# Patient Record
Sex: Male | Born: 1945 | Race: White | Hispanic: No | Marital: Married | Smoking: Never smoker
Health system: Southern US, Community
[De-identification: ages and names within clinical notes are randomized; demographics above are authoritative.]

## PROBLEM LIST (undated history)

## (undated) DIAGNOSIS — D693 Immune thrombocytopenic purpura: Secondary | ICD-10-CM

## (undated) DIAGNOSIS — C61 Malignant neoplasm of prostate: Secondary | ICD-10-CM

## (undated) HISTORY — DX: Malignant neoplasm of prostate: C61

## (undated) HISTORY — DX: Immune thrombocytopenic purpura: D69.3

---

## 1978-04-24 HISTORY — PX: BILATERAL KNEE ARTHROSCOPY: SUR91

## 1993-04-24 HISTORY — PX: NEPHRECTOMY: SHX65

## 2006-06-11 DIAGNOSIS — C61 Malignant neoplasm of prostate: Secondary | ICD-10-CM

## 2006-06-11 HISTORY — DX: Malignant neoplasm of prostate: C61

## 2006-07-03 ENCOUNTER — Ambulatory Visit: Admission: RE | Admit: 2006-07-03 | Discharge: 2006-09-07 | Payer: Self-pay | Admitting: Radiation Oncology

## 2006-09-05 ENCOUNTER — Encounter (INDEPENDENT_AMBULATORY_CARE_PROVIDER_SITE_OTHER): Payer: Self-pay | Admitting: Specialist

## 2006-09-05 ENCOUNTER — Inpatient Hospital Stay (HOSPITAL_COMMUNITY): Admission: RE | Admit: 2006-09-05 | Discharge: 2006-09-06 | Payer: Self-pay | Admitting: Urology

## 2008-12-23 DIAGNOSIS — D693 Immune thrombocytopenic purpura: Secondary | ICD-10-CM

## 2008-12-23 HISTORY — DX: Immune thrombocytopenic purpura: D69.3

## 2009-01-13 ENCOUNTER — Inpatient Hospital Stay (HOSPITAL_COMMUNITY): Admission: EM | Admit: 2009-01-13 | Discharge: 2009-01-15 | Payer: Self-pay | Admitting: Emergency Medicine

## 2009-01-13 ENCOUNTER — Ambulatory Visit: Payer: Self-pay | Admitting: Oncology

## 2009-01-15 ENCOUNTER — Ambulatory Visit: Payer: Self-pay | Admitting: Oncology

## 2009-01-18 LAB — CBC WITH DIFFERENTIAL/PLATELET
Basophils Absolute: 0 10*3/uL (ref 0.0–0.1)
HCT: 49.5 % (ref 38.4–49.9)
HGB: 17.6 g/dL — ABNORMAL HIGH (ref 13.0–17.1)
MONO#: 0.6 10*3/uL (ref 0.1–0.9)
NEUT%: 85.8 % — ABNORMAL HIGH (ref 39.0–75.0)
WBC: 11.5 10*3/uL — ABNORMAL HIGH (ref 4.0–10.3)
lymph#: 1 10*3/uL (ref 0.9–3.3)

## 2009-01-21 LAB — CBC WITH DIFFERENTIAL/PLATELET
Basophils Absolute: 0 10*3/uL (ref 0.0–0.1)
EOS%: 0.5 % (ref 0.0–7.0)
HCT: 49.2 % (ref 38.4–49.9)
HGB: 17.9 g/dL — ABNORMAL HIGH (ref 13.0–17.1)
MCH: 33.5 pg — ABNORMAL HIGH (ref 27.2–33.4)
MCV: 92.1 fL (ref 79.3–98.0)
MONO%: 10.1 % (ref 0.0–14.0)
NEUT%: 64.7 % (ref 39.0–75.0)
lymph#: 2.9 10*3/uL (ref 0.9–3.3)

## 2009-01-21 LAB — BASIC METABOLIC PANEL
BUN: 24 mg/dL — ABNORMAL HIGH (ref 6–23)
Chloride: 100 mEq/L (ref 96–112)
Creatinine, Ser: 1.15 mg/dL (ref 0.40–1.50)
Glucose, Bld: 110 mg/dL — ABNORMAL HIGH (ref 70–99)

## 2009-01-27 LAB — CBC WITH DIFFERENTIAL/PLATELET
Eosinophils Absolute: 0.1 10*3/uL (ref 0.0–0.5)
HCT: 49.1 % (ref 38.4–49.9)
LYMPH%: 20.9 % (ref 14.0–49.0)
MCHC: 35.4 g/dL (ref 32.0–36.0)
MCV: 94.6 fL (ref 79.3–98.0)
MONO#: 1 10*3/uL — ABNORMAL HIGH (ref 0.1–0.9)
MONO%: 8.4 % (ref 0.0–14.0)
NEUT#: 8.6 10*3/uL — ABNORMAL HIGH (ref 1.5–6.5)
NEUT%: 69.8 % (ref 39.0–75.0)
Platelets: 53 10*3/uL — ABNORMAL LOW (ref 140–400)
WBC: 12.3 10*3/uL — ABNORMAL HIGH (ref 4.0–10.3)

## 2009-02-02 LAB — BASIC METABOLIC PANEL
CO2: 25 mEq/L (ref 19–32)
Calcium: 9.4 mg/dL (ref 8.4–10.5)
Chloride: 100 mEq/L (ref 96–112)
Glucose, Bld: 108 mg/dL — ABNORMAL HIGH (ref 70–99)
Potassium: 4.2 mEq/L (ref 3.5–5.3)
Sodium: 138 mEq/L (ref 135–145)

## 2009-02-02 LAB — CBC WITH DIFFERENTIAL/PLATELET
Basophils Absolute: 0 10*3/uL (ref 0.0–0.1)
EOS%: 0 % (ref 0.0–7.0)
HCT: 51.8 % — ABNORMAL HIGH (ref 38.4–49.9)
HGB: 18 g/dL — ABNORMAL HIGH (ref 13.0–17.1)
LYMPH%: 3.9 % — ABNORMAL LOW (ref 14.0–49.0)
MCH: 33.9 pg — ABNORMAL HIGH (ref 27.2–33.4)
MCHC: 34.8 g/dL (ref 32.0–36.0)
MCV: 97.5 fL (ref 79.3–98.0)
MONO%: 2.6 % (ref 0.0–14.0)
NEUT%: 93.5 % — ABNORMAL HIGH (ref 39.0–75.0)

## 2009-02-10 LAB — CBC WITH DIFFERENTIAL/PLATELET
BASO%: 0.1 % (ref 0.0–2.0)
Basophils Absolute: 0 10*3/uL (ref 0.0–0.1)
EOS%: 0.5 % (ref 0.0–7.0)
HCT: 52.5 % — ABNORMAL HIGH (ref 38.4–49.9)
HGB: 18.5 g/dL — ABNORMAL HIGH (ref 13.0–17.1)
LYMPH%: 4.2 % — ABNORMAL LOW (ref 14.0–49.0)
MCH: 34.2 pg — ABNORMAL HIGH (ref 27.2–33.4)
MCHC: 35.2 g/dL (ref 32.0–36.0)
MCV: 97 fL (ref 79.3–98.0)
NEUT%: 91 % — ABNORMAL HIGH (ref 39.0–75.0)
Platelets: 57 10*3/uL — ABNORMAL LOW (ref 140–400)
lymph#: 0.6 10*3/uL — ABNORMAL LOW (ref 0.9–3.3)

## 2009-02-10 LAB — BASIC METABOLIC PANEL
CO2: 26 mEq/L (ref 19–32)
Chloride: 105 mEq/L (ref 96–112)
Creatinine, Ser: 1.17 mg/dL (ref 0.40–1.50)
Glucose, Bld: 99 mg/dL (ref 70–99)

## 2009-02-10 LAB — MORPHOLOGY: PLT EST: DECREASED

## 2009-02-15 ENCOUNTER — Ambulatory Visit: Payer: Self-pay | Admitting: Oncology

## 2009-02-17 LAB — CBC WITH DIFFERENTIAL/PLATELET
BASO%: 0 % (ref 0.0–2.0)
Basophils Absolute: 0 10*3/uL (ref 0.0–0.1)
EOS%: 0.3 % (ref 0.0–7.0)
HCT: 56 % — ABNORMAL HIGH (ref 38.4–49.9)
HGB: 19 g/dL — ABNORMAL HIGH (ref 13.0–17.1)
MCH: 33.7 pg — ABNORMAL HIGH (ref 27.2–33.4)
MCHC: 34 g/dL (ref 32.0–36.0)
MCV: 99.2 fL — ABNORMAL HIGH (ref 79.3–98.0)
MONO%: 6.7 % (ref 0.0–14.0)
NEUT%: 79.3 % — ABNORMAL HIGH (ref 39.0–75.0)

## 2009-02-17 LAB — BASIC METABOLIC PANEL
Calcium: 9.4 mg/dL (ref 8.4–10.5)
Sodium: 137 mEq/L (ref 135–145)

## 2009-02-24 IMAGING — CR DG CHEST 2V
2 series · 2 of 2 positions shown · non-contrast
Comparison: None.

CLINICAL DATA: Prostate cancer.  Preop.  
 CHEST - 2 VIEW:

[view not recorded (1 of 2)]
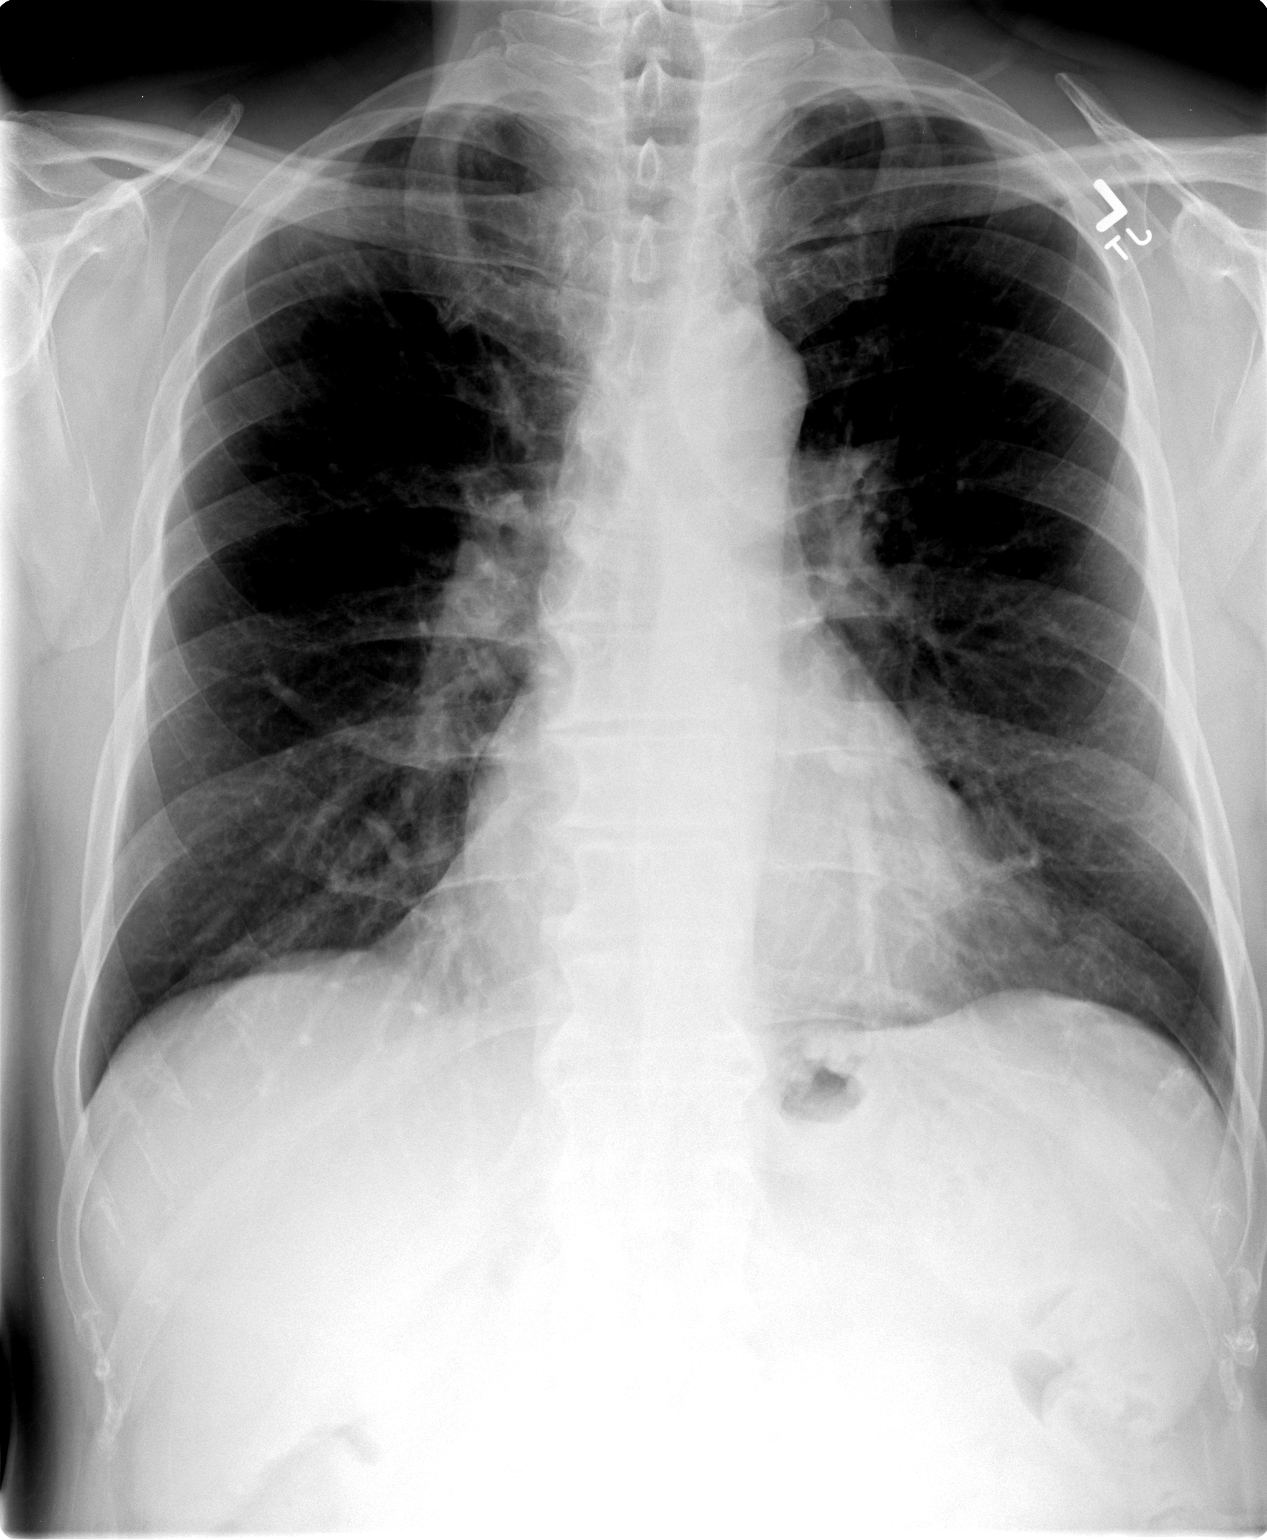

[view not recorded (2 of 2)]
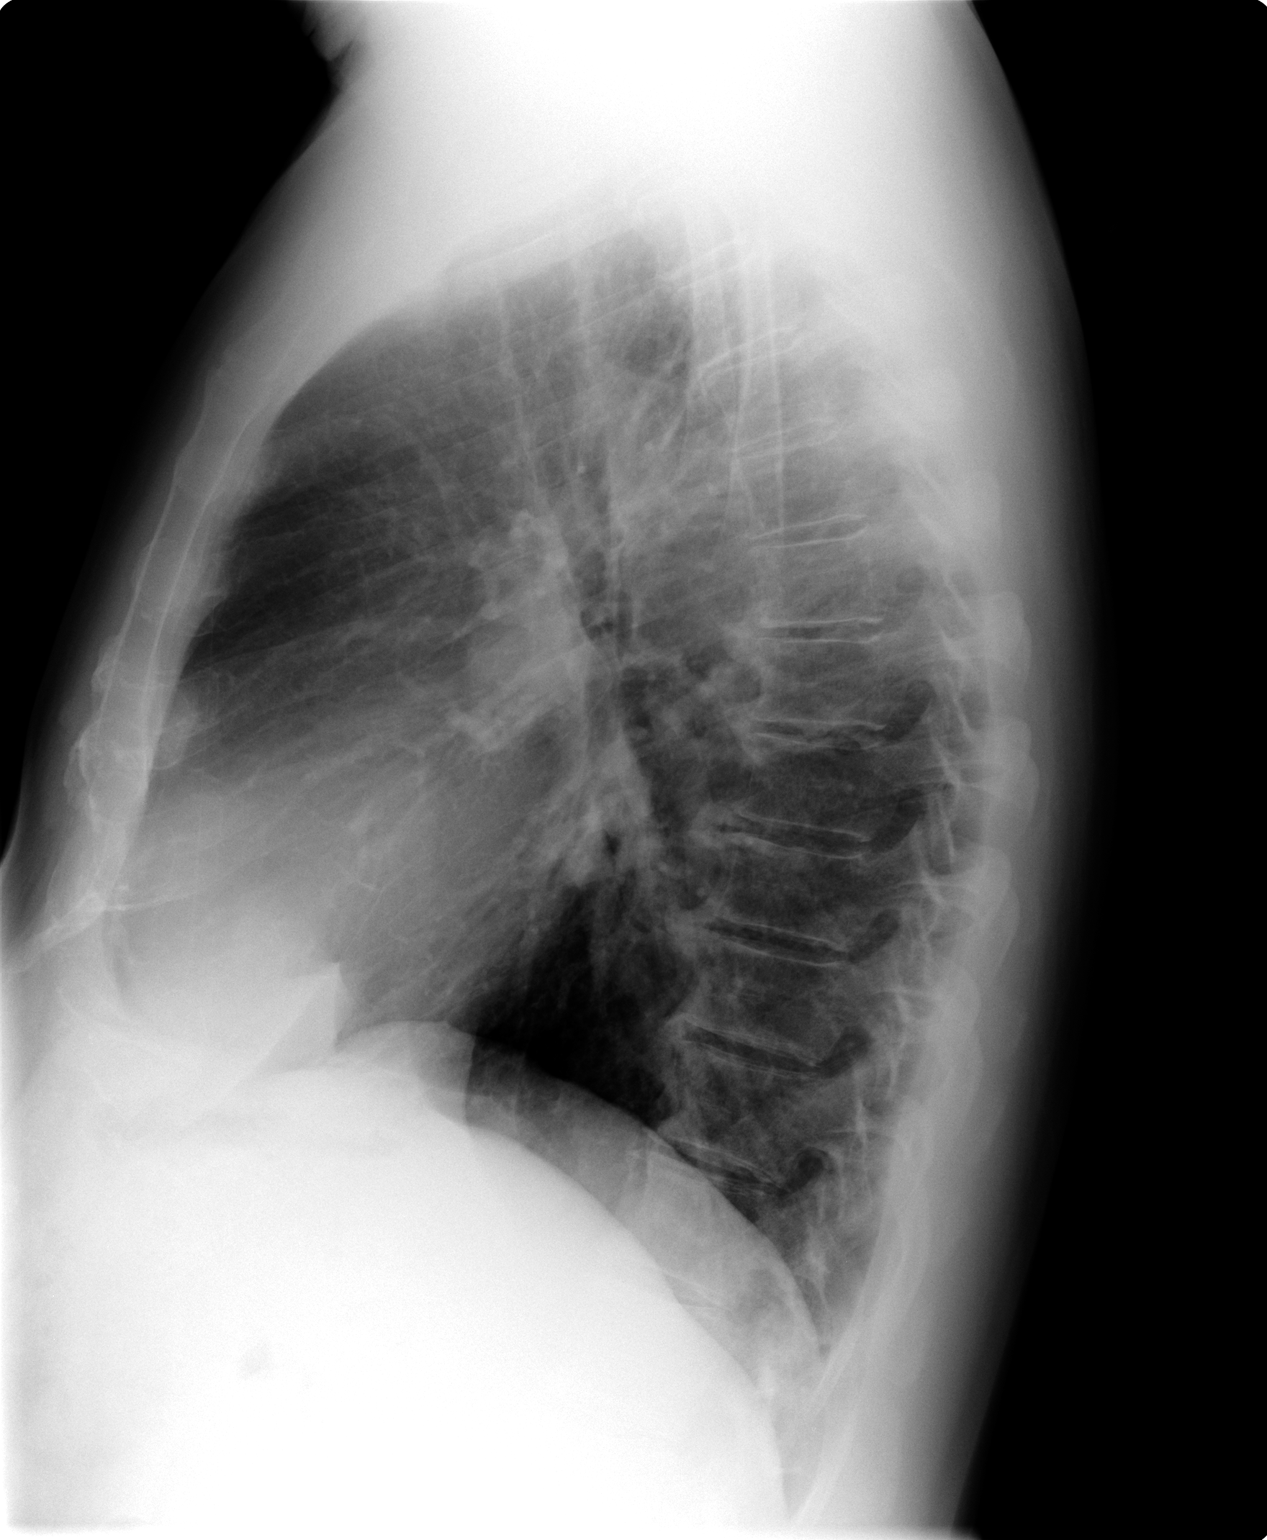

[2 of 2 positions shown; findings below may reference images not displayed]

FINDINGS: Heart size is within normal limits.  There is hyperinflation with change of COPD.  No infiltrate, effusion or mass.
IMPRESSION: COPD.  No acute abnormality.

## 2009-02-26 LAB — CBC WITH DIFFERENTIAL/PLATELET
Basophils Absolute: 0 10*3/uL (ref 0.0–0.1)
Eosinophils Absolute: 0 10*3/uL (ref 0.0–0.5)
HGB: 18.2 g/dL — ABNORMAL HIGH (ref 13.0–17.1)
MCV: 95.4 fL (ref 79.3–98.0)
MONO%: 3.5 % (ref 0.0–14.0)
NEUT#: 11.4 10*3/uL — ABNORMAL HIGH (ref 1.5–6.5)
Platelets: 21 10*3/uL — ABNORMAL LOW (ref 140–400)
RDW: 13 % (ref 11.0–14.6)

## 2009-02-26 LAB — BASIC METABOLIC PANEL
CO2: 25 mEq/L (ref 19–32)
Calcium: 8.3 mg/dL — ABNORMAL LOW (ref 8.4–10.5)
Glucose, Bld: 91 mg/dL (ref 70–99)
Sodium: 136 mEq/L (ref 135–145)

## 2009-03-04 LAB — COMPREHENSIVE METABOLIC PANEL
AST: 27 U/L (ref 0–37)
Albumin: 3.8 g/dL (ref 3.5–5.2)
BUN: 28 mg/dL — ABNORMAL HIGH (ref 6–23)
Calcium: 8.8 mg/dL (ref 8.4–10.5)
Chloride: 101 mEq/L (ref 96–112)
Potassium: 3.8 mEq/L (ref 3.5–5.3)
Total Protein: 6.1 g/dL (ref 6.0–8.3)

## 2009-03-04 LAB — CBC WITH DIFFERENTIAL/PLATELET
BASO%: 0.2 % (ref 0.0–2.0)
EOS%: 0 % (ref 0.0–7.0)
HCT: 51.9 % — ABNORMAL HIGH (ref 38.4–49.9)
LYMPH%: 9.4 % — ABNORMAL LOW (ref 14.0–49.0)
MCH: 33.9 pg — ABNORMAL HIGH (ref 27.2–33.4)
MCHC: 34.8 g/dL (ref 32.0–36.0)
MCV: 97.6 fL (ref 79.3–98.0)
MONO%: 5.9 % (ref 0.0–14.0)
NEUT%: 84.5 % — ABNORMAL HIGH (ref 39.0–75.0)
Platelets: 71 10*3/uL — ABNORMAL LOW (ref 140–400)
lymph#: 1 10*3/uL (ref 0.9–3.3)

## 2009-03-12 LAB — CBC WITH DIFFERENTIAL/PLATELET
BASO%: 0.2 % (ref 0.0–2.0)
EOS%: 0 % (ref 0.0–7.0)
Eosinophils Absolute: 0 10*3/uL (ref 0.0–0.5)
MCH: 33.9 pg — ABNORMAL HIGH (ref 27.2–33.4)
MCHC: 35 g/dL (ref 32.0–36.0)
MCV: 96.9 fL (ref 79.3–98.0)
MONO%: 6.4 % (ref 0.0–14.0)
NEUT#: 8.6 10*3/uL — ABNORMAL HIGH (ref 1.5–6.5)
RBC: 5.16 10*6/uL (ref 4.20–5.82)
RDW: 12.7 % (ref 11.0–14.6)

## 2009-03-12 LAB — COMPREHENSIVE METABOLIC PANEL
AST: 22 U/L (ref 0–37)
Alkaline Phosphatase: 66 U/L (ref 39–117)
BUN: 20 mg/dL (ref 6–23)
Creatinine, Ser: 1.08 mg/dL (ref 0.40–1.50)
Glucose, Bld: 85 mg/dL (ref 70–99)
Total Bilirubin: 1.6 mg/dL — ABNORMAL HIGH (ref 0.3–1.2)

## 2009-03-12 LAB — MORPHOLOGY

## 2009-03-15 ENCOUNTER — Ambulatory Visit: Payer: Self-pay | Admitting: Oncology

## 2009-03-19 LAB — CBC WITH DIFFERENTIAL/PLATELET
EOS%: 0 % (ref 0.0–7.0)
Eosinophils Absolute: 0 10*3/uL (ref 0.0–0.5)
LYMPH%: 19.2 % (ref 14.0–49.0)
MCH: 33 pg (ref 27.2–33.4)
MCV: 95 fL (ref 79.3–98.0)
MONO%: 10.9 % (ref 0.0–14.0)
NEUT#: 6.3 10*3/uL (ref 1.5–6.5)
Platelets: 143 10*3/uL (ref 140–400)
RBC: 5.39 10*6/uL (ref 4.20–5.82)
RDW: 13 % (ref 11.0–14.6)
nRBC: 0 % (ref 0–0)

## 2009-03-20 ENCOUNTER — Emergency Department (HOSPITAL_COMMUNITY): Admission: EM | Admit: 2009-03-20 | Discharge: 2009-03-20 | Payer: Self-pay | Admitting: Emergency Medicine

## 2009-03-26 LAB — CBC WITH DIFFERENTIAL/PLATELET
BASO%: 1 % (ref 0.0–2.0)
Basophils Absolute: 0.1 10*3/uL (ref 0.0–0.1)
EOS%: 0.3 % (ref 0.0–7.0)
HCT: 49.3 % (ref 38.4–49.9)
HGB: 17.3 g/dL — ABNORMAL HIGH (ref 13.0–17.1)
LYMPH%: 19.6 % (ref 14.0–49.0)
MCH: 33.5 pg — ABNORMAL HIGH (ref 27.2–33.4)
MCHC: 35.1 g/dL (ref 32.0–36.0)
MCV: 95.4 fL (ref 79.3–98.0)
MONO%: 8.9 % (ref 0.0–14.0)
NEUT%: 70.2 % (ref 39.0–75.0)
Platelets: 135 10*3/uL — ABNORMAL LOW (ref 140–400)

## 2009-03-26 LAB — COMPREHENSIVE METABOLIC PANEL
ALT: 40 U/L (ref 0–53)
BUN: 23 mg/dL (ref 6–23)
CO2: 30 mEq/L (ref 19–32)
Calcium: 8.8 mg/dL (ref 8.4–10.5)
Chloride: 103 mEq/L (ref 96–112)
Creatinine, Ser: 1.2 mg/dL (ref 0.40–1.50)
Glucose, Bld: 75 mg/dL (ref 70–99)
Total Bilirubin: 1.6 mg/dL — ABNORMAL HIGH (ref 0.3–1.2)

## 2009-03-26 LAB — LACTATE DEHYDROGENASE: LDH: 238 U/L (ref 94–250)

## 2009-03-26 LAB — MORPHOLOGY: PLT EST: DECREASED

## 2009-04-02 LAB — CBC WITH DIFFERENTIAL/PLATELET
BASO%: 0.8 % (ref 0.0–2.0)
Basophils Absolute: 0.1 10*3/uL (ref 0.0–0.1)
EOS%: 1.1 % (ref 0.0–7.0)
HGB: 17.4 g/dL — ABNORMAL HIGH (ref 13.0–17.1)
MCH: 32.9 pg (ref 27.2–33.4)
MONO#: 0.7 10*3/uL (ref 0.1–0.9)
RDW: 13.1 % (ref 11.0–14.6)
WBC: 6.6 10*3/uL (ref 4.0–10.3)
lymph#: 1.3 10*3/uL (ref 0.9–3.3)

## 2009-04-09 LAB — CBC WITH DIFFERENTIAL/PLATELET
Basophils Absolute: 0.1 10*3/uL (ref 0.0–0.1)
Eosinophils Absolute: 0.1 10*3/uL (ref 0.0–0.5)
HCT: 49.6 % (ref 38.4–49.9)
HGB: 17.4 g/dL — ABNORMAL HIGH (ref 13.0–17.1)
MCV: 94.1 fL (ref 79.3–98.0)
NEUT#: 4.5 10*3/uL (ref 1.5–6.5)
RDW: 13.4 % (ref 11.0–14.6)
lymph#: 1.3 10*3/uL (ref 0.9–3.3)

## 2009-04-19 ENCOUNTER — Ambulatory Visit: Payer: Self-pay | Admitting: Oncology

## 2009-04-21 LAB — CBC WITH DIFFERENTIAL/PLATELET
Basophils Absolute: 0 10*3/uL (ref 0.0–0.1)
Eosinophils Absolute: 0.1 10*3/uL (ref 0.0–0.5)
HGB: 17.4 g/dL — ABNORMAL HIGH (ref 13.0–17.1)
LYMPH%: 24.3 % (ref 14.0–49.0)
MCV: 96.7 fL (ref 79.3–98.0)
MONO%: 8.4 % (ref 0.0–14.0)
NEUT#: 5.6 10*3/uL (ref 1.5–6.5)
NEUT%: 65.7 % (ref 39.0–75.0)
Platelets: 177 10*3/uL (ref 140–400)

## 2009-04-21 LAB — MORPHOLOGY

## 2009-05-12 LAB — CBC WITH DIFFERENTIAL/PLATELET
BASO%: 0.5 % (ref 0.0–2.0)
Basophils Absolute: 0 10e3/uL (ref 0.0–0.1)
EOS%: 2.4 % (ref 0.0–7.0)
Eosinophils Absolute: 0.2 10e3/uL (ref 0.0–0.5)
HCT: 49.9 % (ref 38.4–49.9)
HGB: 17.5 g/dL — ABNORMAL HIGH (ref 13.0–17.1)
LYMPH%: 25.1 % (ref 14.0–49.0)
MCH: 33.8 pg — ABNORMAL HIGH (ref 27.2–33.4)
MCHC: 35 g/dL (ref 32.0–36.0)
MCV: 96.5 fL (ref 79.3–98.0)
MONO#: 0.6 10e3/uL (ref 0.1–0.9)
MONO%: 7.8 % (ref 0.0–14.0)
NEUT#: 4.8 10e3/uL (ref 1.5–6.5)
NEUT%: 64.2 % (ref 39.0–75.0)
Platelets: 159 10e3/uL (ref 140–400)
RBC: 5.17 10e6/uL (ref 4.20–5.82)
RDW: 13.6 % (ref 11.0–14.6)
WBC: 7.5 10e3/uL (ref 4.0–10.3)
lymph#: 1.9 10e3/uL (ref 0.9–3.3)

## 2009-05-12 LAB — COMPREHENSIVE METABOLIC PANEL WITH GFR
ALT: 26 U/L (ref 0–53)
AST: 24 U/L (ref 0–37)
Albumin: 4.3 g/dL (ref 3.5–5.2)
Alkaline Phosphatase: 79 U/L (ref 39–117)
BUN: 17 mg/dL (ref 6–23)
CO2: 27 meq/L (ref 19–32)
Calcium: 9.1 mg/dL (ref 8.4–10.5)
Chloride: 103 meq/L (ref 96–112)
Creatinine, Ser: 1.07 mg/dL (ref 0.40–1.50)
Glucose, Bld: 75 mg/dL (ref 70–99)
Potassium: 4.3 meq/L (ref 3.5–5.3)
Sodium: 142 meq/L (ref 135–145)
Total Bilirubin: 2.4 mg/dL — ABNORMAL HIGH (ref 0.3–1.2)
Total Protein: 6.4 g/dL (ref 6.0–8.3)

## 2009-05-31 ENCOUNTER — Ambulatory Visit: Payer: Self-pay | Admitting: Oncology

## 2009-06-02 LAB — CBC WITH DIFFERENTIAL/PLATELET
Basophils Absolute: 0 10*3/uL (ref 0.0–0.1)
EOS%: 2.7 % (ref 0.0–7.0)
HCT: 50.8 % — ABNORMAL HIGH (ref 38.4–49.9)
HGB: 17.8 g/dL — ABNORMAL HIGH (ref 13.0–17.1)
MCH: 33.8 pg — ABNORMAL HIGH (ref 27.2–33.4)
MCV: 96.3 fL (ref 79.3–98.0)
MONO%: 8.4 % (ref 0.0–14.0)
NEUT%: 61.9 % (ref 39.0–75.0)
Platelets: 149 10*3/uL (ref 140–400)

## 2009-06-02 LAB — URINALYSIS, MICROSCOPIC - CHCC
Bacteria, UA: NEGATIVE
Bilirubin (Urine): NEGATIVE
Glucose: NEGATIVE g/dL
Ketones: 5 mg/dL
pH: 6 (ref 4.6–8.0)

## 2009-06-02 LAB — COMPREHENSIVE METABOLIC PANEL
AST: 28 U/L (ref 0–37)
Alkaline Phosphatase: 77 U/L (ref 39–117)
BUN: 18 mg/dL (ref 6–23)
Calcium: 9 mg/dL (ref 8.4–10.5)
Creatinine, Ser: 1.18 mg/dL (ref 0.40–1.50)

## 2009-06-17 ENCOUNTER — Ambulatory Visit (HOSPITAL_BASED_OUTPATIENT_CLINIC_OR_DEPARTMENT_OTHER): Admission: RE | Admit: 2009-06-17 | Discharge: 2009-06-17 | Payer: Self-pay | Admitting: Urology

## 2009-06-30 ENCOUNTER — Ambulatory Visit: Payer: Self-pay | Admitting: Oncology

## 2009-07-02 LAB — CBC WITH DIFFERENTIAL/PLATELET
Basophils Absolute: 0 10*3/uL (ref 0.0–0.1)
HCT: 52 % — ABNORMAL HIGH (ref 38.4–49.9)
HGB: 18.3 g/dL — ABNORMAL HIGH (ref 13.0–17.1)
MCH: 33.7 pg — ABNORMAL HIGH (ref 27.2–33.4)
MONO#: 0.6 10*3/uL (ref 0.1–0.9)
NEUT%: 61.6 % (ref 39.0–75.0)
WBC: 7.2 10*3/uL (ref 4.0–10.3)
lymph#: 1.9 10*3/uL (ref 0.9–3.3)

## 2009-07-30 ENCOUNTER — Ambulatory Visit: Payer: Self-pay | Admitting: Oncology

## 2009-07-30 LAB — CBC WITH DIFFERENTIAL/PLATELET
Eosinophils Absolute: 0.2 10*3/uL (ref 0.0–0.5)
HCT: 51 % — ABNORMAL HIGH (ref 38.4–49.9)
LYMPH%: 27.3 % (ref 14.0–49.0)
MONO#: 0.6 10*3/uL (ref 0.1–0.9)
NEUT#: 4.6 10*3/uL (ref 1.5–6.5)
Platelets: 151 10*3/uL (ref 140–400)
RBC: 5.52 10*6/uL (ref 4.20–5.82)
WBC: 7.5 10*3/uL (ref 4.0–10.3)
nRBC: 0 % (ref 0–0)

## 2009-09-14 ENCOUNTER — Ambulatory Visit (HOSPITAL_BASED_OUTPATIENT_CLINIC_OR_DEPARTMENT_OTHER): Admission: RE | Admit: 2009-09-14 | Discharge: 2009-09-14 | Payer: Self-pay | Admitting: Urology

## 2009-09-27 ENCOUNTER — Ambulatory Visit: Payer: Self-pay | Admitting: Oncology

## 2009-09-29 LAB — CBC WITH DIFFERENTIAL/PLATELET
Basophils Absolute: 0 10*3/uL (ref 0.0–0.1)
Eosinophils Absolute: 0.2 10*3/uL (ref 0.0–0.5)
HCT: 50.5 % — ABNORMAL HIGH (ref 38.4–49.9)
HGB: 17.9 g/dL — ABNORMAL HIGH (ref 13.0–17.1)
LYMPH%: 28.2 % (ref 14.0–49.0)
MCV: 95.4 fL (ref 79.3–98.0)
MONO%: 8 % (ref 0.0–14.0)
NEUT#: 3.8 10*3/uL (ref 1.5–6.5)
NEUT%: 60.5 % (ref 39.0–75.0)
Platelets: 131 10*3/uL — ABNORMAL LOW (ref 140–400)

## 2009-09-29 LAB — MORPHOLOGY

## 2009-10-28 ENCOUNTER — Ambulatory Visit: Payer: Self-pay | Admitting: Oncology

## 2009-10-28 LAB — MORPHOLOGY: PLT EST: DECREASED

## 2009-10-28 LAB — CBC WITH DIFFERENTIAL/PLATELET
Basophils Absolute: 0 10*3/uL (ref 0.0–0.1)
Eosinophils Absolute: 0.2 10*3/uL (ref 0.0–0.5)
HCT: 50 % — ABNORMAL HIGH (ref 38.4–49.9)
HGB: 17.7 g/dL — ABNORMAL HIGH (ref 13.0–17.1)
NEUT#: 3.8 10*3/uL (ref 1.5–6.5)
RDW: 12.9 % (ref 11.0–14.6)
lymph#: 1.8 10*3/uL (ref 0.9–3.3)

## 2009-12-23 ENCOUNTER — Ambulatory Visit: Payer: Self-pay | Admitting: Oncology

## 2009-12-23 LAB — CBC WITH DIFFERENTIAL/PLATELET
Basophils Absolute: 0 10*3/uL (ref 0.0–0.1)
EOS%: 2.6 % (ref 0.0–7.0)
Eosinophils Absolute: 0.2 10*3/uL (ref 0.0–0.5)
LYMPH%: 24.2 % (ref 14.0–49.0)
MCH: 33 pg (ref 27.2–33.4)
MCV: 96.3 fL (ref 79.3–98.0)
MONO%: 8 % (ref 0.0–14.0)
NEUT#: 4.6 10*3/uL (ref 1.5–6.5)
Platelets: 135 10*3/uL — ABNORMAL LOW (ref 140–400)
RBC: 5.24 10*6/uL (ref 4.20–5.82)

## 2009-12-23 LAB — MORPHOLOGY

## 2010-01-31 ENCOUNTER — Ambulatory Visit: Payer: Self-pay | Admitting: Oncology

## 2010-02-02 LAB — CBC WITH DIFFERENTIAL/PLATELET
BASO%: 0.5 % (ref 0.0–2.0)
Basophils Absolute: 0 10*3/uL (ref 0.0–0.1)
EOS%: 2.9 % (ref 0.0–7.0)
HCT: 51.3 % — ABNORMAL HIGH (ref 38.4–49.9)
HGB: 18.4 g/dL — ABNORMAL HIGH (ref 13.0–17.1)
LYMPH%: 27.5 % (ref 14.0–49.0)
MCH: 33.3 pg (ref 27.2–33.4)
MCHC: 35.9 g/dL (ref 32.0–36.0)
MCV: 92.8 fL (ref 79.3–98.0)
NEUT%: 60.6 % (ref 39.0–75.0)
Platelets: 144 10*3/uL (ref 140–400)
lymph#: 2.2 10*3/uL (ref 0.9–3.3)

## 2010-03-28 ENCOUNTER — Ambulatory Visit: Payer: Self-pay | Admitting: Oncology

## 2010-03-30 LAB — CBC WITH DIFFERENTIAL/PLATELET
BASO%: 0.5 % (ref 0.0–2.0)
EOS%: 1.7 % (ref 0.0–7.0)
HCT: 50.7 % — ABNORMAL HIGH (ref 38.4–49.9)
MCHC: 36.5 g/dL — ABNORMAL HIGH (ref 32.0–36.0)
MONO#: 0.7 10*3/uL (ref 0.1–0.9)
NEUT%: 62.9 % (ref 39.0–75.0)
RBC: 5.55 10*6/uL (ref 4.20–5.82)
RDW: 12.8 % (ref 11.0–14.6)
WBC: 7.6 10*3/uL (ref 4.0–10.3)
lymph#: 2 10*3/uL (ref 0.9–3.3)

## 2010-03-30 LAB — MORPHOLOGY
PLT EST: DECREASED
RBC Comments: NORMAL

## 2010-03-30 LAB — PSA: PSA: <0.01 ng/mL (ref <=4.00)

## 2010-07-11 LAB — CBC
MCHC: 34.5 g/dL (ref 30.0–36.0)
Platelets: 101 10*3/uL — ABNORMAL LOW (ref 150–400)
RDW: 13.1 % (ref 11.5–15.5)

## 2010-07-29 LAB — PROTEIN ELECTROPHORESIS, SERUM
Alpha-1-Globulin: 3.5 % (ref 2.9–4.9)
Gamma Globulin: 15.4 % (ref 11.1–18.8)
Total Protein ELP: 6.1 g/dL (ref 6.0–8.3)

## 2010-07-29 LAB — COMPREHENSIVE METABOLIC PANEL
ALT: 22 U/L (ref 0–53)
AST: 31 U/L (ref 0–37)
Alkaline Phosphatase: 88 U/L (ref 39–117)
BUN: 19 mg/dL (ref 6–23)
CO2: 23 mEq/L (ref 19–32)
CO2: 27 mEq/L (ref 19–32)
Calcium: 9.1 mg/dL (ref 8.4–10.5)
Calcium: 9.1 mg/dL (ref 8.4–10.5)
Creatinine, Ser: 1.03 mg/dL (ref 0.4–1.5)
GFR calc Af Amer: >60 mL/min (ref >60)
GFR calc non Af Amer: >60 mL/min (ref >60)
GFR calc non Af Amer: >60 mL/min (ref >60)
Glucose, Bld: 170 mg/dL — ABNORMAL HIGH (ref 70–99)
Glucose, Bld: 97 mg/dL (ref 70–99)
Potassium: 3.9 mEq/L (ref 3.5–5.1)
Sodium: 137 mEq/L (ref 135–145)
Sodium: 139 mEq/L (ref 135–145)
Total Protein: 6.1 g/dL (ref 6.0–8.3)

## 2010-07-29 LAB — PREPARE FRESH FROZEN PLASMA

## 2010-07-29 LAB — CBC
HCT: 49.5 % (ref 39.0–52.0)
Hemoglobin: 17 g/dL (ref 13.0–17.0)
Hemoglobin: 17.5 g/dL — ABNORMAL HIGH (ref 13.0–17.0)
MCHC: 34.5 g/dL (ref 30.0–36.0)
MCHC: 35.1 g/dL (ref 30.0–36.0)
MCV: 97 fL (ref 78.0–100.0)
MCV: 98.3 fL (ref 78.0–100.0)
MCV: 99.1 fL (ref 78.0–100.0)
Platelets: <5 10*3/uL — CL (ref 150–400)
RBC: 5.09 MIL/uL (ref 4.22–5.81)
RBC: 5.15 MIL/uL (ref 4.22–5.81)
RDW: 12.8 % (ref 11.5–15.5)
RDW: 13.2 % (ref 11.5–15.5)
WBC: 7.4 10*3/uL (ref 4.0–10.5)
WBC: 7.7 10*3/uL (ref 4.0–10.5)

## 2010-07-29 LAB — DIFFERENTIAL
Basophils Absolute: 0 10*3/uL (ref 0.0–0.1)
Basophils Absolute: 0.1 10*3/uL (ref 0.0–0.1)
Basophils Relative: 0 % (ref 0–1)
Basophils Relative: 1 % (ref 0–1)
Eosinophils Absolute: 0 10*3/uL (ref 0.0–0.7)
Eosinophils Absolute: 0.3 10*3/uL (ref 0.0–0.7)
Eosinophils Relative: 4 % (ref 0–5)
Lymphocytes Relative: 24 % (ref 12–46)
Lymphs Abs: 1.8 10*3/uL (ref 0.7–4.0)
Lymphs Abs: 2.1 10*3/uL (ref 0.7–4.0)
Monocytes Absolute: 0.7 10*3/uL (ref 0.1–1.0)
Monocytes Relative: 9 % (ref 3–12)
Neutro Abs: 12.3 10*3/uL — ABNORMAL HIGH (ref 1.7–7.7)
Neutrophils Relative %: 56 % (ref 43–77)
Neutrophils Relative %: 63 % (ref 43–77)
Neutrophils Relative %: 82 % — ABNORMAL HIGH (ref 43–77)
Smear Review: NONE SEEN

## 2010-07-29 LAB — HEPATITIS PANEL, ACUTE
HCV Ab: NEGATIVE
Hep A IgM: NEGATIVE

## 2010-07-29 LAB — PROTIME-INR: INR: 1 (ref 0.00–1.49)

## 2010-07-29 LAB — HEPARIN INDUCED THROMBOCYTOPENIA PNL
Heparin Induced Plt Ab: NEGATIVE
Patient O.D.: 0.21
Serotonin Release: 1 % release (ref <20)

## 2010-07-29 LAB — APTT: aPTT: 33 seconds (ref 24–37)

## 2010-07-29 LAB — GLUCOSE, CAPILLARY
Glucose-Capillary: 117 mg/dL — ABNORMAL HIGH (ref 70–99)
Glucose-Capillary: 148 mg/dL — ABNORMAL HIGH (ref 70–99)
Glucose-Capillary: 150 mg/dL — ABNORMAL HIGH (ref 70–99)
Glucose-Capillary: 98 mg/dL (ref 70–99)

## 2010-07-29 LAB — DIC (DISSEMINATED INTRAVASCULAR COAGULATION)PANEL: D-Dimer, Quant: <0.22 ug/mL-FEU (ref 0.00–0.48)

## 2010-07-29 LAB — HIV ANTIBODY (ROUTINE TESTING W REFLEX): HIV: NONREACTIVE

## 2010-07-29 LAB — TYPE AND SCREEN: ABO/RH(D): B POS

## 2010-09-09 NOTE — Op Note (Signed)
[name], [name] NO.:  [id number]   MEDICAL RECORD NO.:  [id number]          PATIENT TYPE:  INP   LOCATION:  1423                         FACILITY:  [facility]   PHYSICIAN:  Heloise Purpura, MD      DATE OF BIRTH:  01-Jan-1946   DATE OF PROCEDURE:  09/05/2006  DATE OF DISCHARGE:                               OPERATIVE REPORT   PREOPERATIVE DIAGNOSIS:  Clinically localized adenocarcinoma of the  prostate.   POSTOPERATIVE DIAGNOSIS:  Clinically localized adenocarcinoma of the  prostate.   PROCEDURE:  Robotic assisted laparoscopic radical prostatectomy  (bilateral nerve sparing)   SURGEON:  Heloise Purpura, MD   ASSISTANT:  Dr. Garrison Columbus.   ANESTHESIA:  General.   COMPLICATIONS:  None.   ESTIMATED BLOOD LOSS:  150 cc.   SPECIMENS:  Prostate and seminal vesicles.   DISPOSITION OF SPECIMEN:  To pathology.   DRAINS:  1. 20-French Coude catheter.  2. Number 19 Blake pelvic drain.   INDICATIONS:  The patient is a 65 year old gentleman with clinical stage  T1-C prostate cancer with a PSA of 6.1 and Gleason score 3+3 equals 6.  Pretreatment AUA symptom score was 8 with an IIEF score of 19/25.  After  discussing management options for clinically localized prostate cancer,  the patient elected to proceed with surgical therapy and the above  procedure.  Potential risks and benefits, as well as alternatives were  discussed with the patient and informed consent was obtained.   DESCRIPTION OF PROCEDURE:  The patient was taken to the operating room  and a general anesthetic was administered.  He was given preoperative  antibiotics, placed in the dorsal lithotomy position, and prepped and  draped in the usual sterile fashion.  Next a preoperative time-out was  performed.  A site was then selected 18 cm from the pubic symphysis and  just to the left of the umbilicus for placement of the camera port.  This was placed using a standard open Hassan technique.  This  allowed  entry into the peritoneal cavity under direct vision without difficulty.  A 12 mm port was then placed and a pneumoperitoneum was established.  The 0 degrees lens was then used to inspect the abdomen and there was no  evidence of any intra-abdominal injuries or other abnormalities.  The  remaining ports were then placed.  Bilateral 8 mm robotic ports were  placed a handbreadth lateral to the camera port bilaterally and just  inferior to the level of the umbilicus.  An additional 8 mm port was  placed in the far left lateral abdominal wall.  A 5 mm port was placed  between the camera port and the right robotic port.  An additional 12 mm  port was placed in the far right lateral abdominal wall for laparoscopic  assistance.  All ports were placed under direct vision and without  difficulty.  The surgical cart was then docked.  With the aid of the  cautery scissors, the bladder was reflected posteriorly allowing entry  into the space of Retzius and identification of the endopelvic fascia  and  prostate.  The endopelvic fascia was then incised from the apex back  to the base of the prostate bilaterally and the underlying levator  muscle fibers were swept laterally off the prostate, thereby isolating  the dorsal venous complex.  The dorsal venous complex was then stapled  and divided with a 45 mm flex ETS stapler.  Attention then turned to the  bladder neck which was identified with the aid of Foley catheter  manipulation.  The anterior bladder neck was incised, thereby exposing  the Foley catheter.  The catheter balloon was deflated and the catheter  was brought into the operative field and used to retract the prostate  anteriorly.  The posterior bladder neck was then divided and dissection  continued between the bladder neck and prostate until the vasa  deferentia and seminal vesicles were identified.  The vasa deferentia  were isolated and divided.  The seminal vesicles were then  dissected  down to their tips with care to control the seminal vesicle arterial  blood supply and similarly lifted anteriorly.  The space between  Denonvillier fascia and the anterior rectum was then bluntly developed,  thereby isolating the vascular pedicles of the prostate.  The lateral  prostatic fascia was then incised bilaterally and the neurovascular  bundles were swept laterally and posteriorly off the prostate.  The  vascular pedicles of the prostate were then ligated with Hem-o-lok clips  and sharply divided above the level of the neurovascular bundles with  athermal cold scissor dissection.  The neurovascular bundles were then  swept off the apex of the prostate and the urethra was sharply divided  allowing the prostate specimen to be disarticulated.  The pelvis was  then copiously irrigated and hemostasis was ensured.  With irrigation of  the pelvis, air was injected into the rectal catheter and there was no  evidence of a rectal injury.  Attention then turned to the urethral  anastomosis.  A 2-0 Vicryl slip-knot was placed at the 6 o'clock  position between the bladder neck and urethra to reapproximate these  structures.  A double-armed 3-0 Monocryl suture was then used to perform  a 360 degree running tension-free anastomosis between the bladder neck  and urethra.  A new 20-French Coude catheter was inserted into the  bladder and irrigated.  The anastomosis appeared to be watertight and  there did not appear to be any blood clots within the bladder.  A #19  Blake drain was then brought through the left robotic port and  appropriately positioned in the pelvis.  It was secured to the skin with  a nylon suture.  The prostate specimen was removed via the periumbilical  port site, intact within the Endopouch retrieval bag.  This fascial  opening was closed with a running 0 Vicryl suture.  The right lateral 12 mm port site was closed with a 0 Vicryl suture placed with the aid of   the suture passer device.  All other ports were removed under direct  vision.  The port sites were then injected with 0.25% Marcaine and  reapproximated at the skin level with staples.  Sterile dressings were  applied.  The patient appeared to tolerate the procedure well without  complications.  He was able to be extubated and transferred to the  recovery unit in satisfactory condition.           ______________________________  Heloise Purpura, MD  Electronically Signed     LB/MEDQ  D:  09/05/2006  T:  09/06/2006  Job:  [id number]

## 2010-09-09 NOTE — H&P (Signed)
NAMEGLENDAL, CASSADAY NO.:  [id number]   MEDICAL RECORD NO.:  [id number]          PATIENT TYPE:  INP   LOCATION:  1423                         FACILITY:  [facility]   PHYSICIAN:  Heloise Purpura, MD      DATE OF BIRTH:  1945-08-24   DATE OF ADMISSION:  09/05/2006  DATE OF DISCHARGE:                              HISTORY & PHYSICAL   CHIEF COMPLAINT:  Prostate cancer.   HISTORY OF PRESENT ILLNESS:  Mr. Hackman is a 65 year old gentleman with  clinical stage T1c prostate cancer with a PSA of 6.1 and Gleason score  of 3 + 3 = 6 in 1/12 cores of his prostate.  His biopsy was performed on  June 11, 2006.  After discussing treatment and management options  for clinically localized prostate cancer, the patient elected to proceed  with surgical therapy and specifically a robotic prostatectomy.   PAST MEDICAL HISTORY:  Hypertension.   PAST SURGICAL HISTORY:  1. Right nephrectomy secondary to trauma in 1975.  2. Left thumb dislocation.  3. Left knee arthroscopy in 2002.  4. Right knee arthroscopy in 2004.   MEDICATIONS:  1. Cialis/Levitra p.r.n.  2. Excedrin.  3. Omega-3.  4. Multivitamins.  5. Aspirin.   ALLERGIES:  The patient has a questionable allergy to MORPHINE.   FAMILY HISTORY:  There is no history of prostate cancer.  There is a  history of coronary artery disease.   SOCIAL HISTORY:  The patient denies tobacco or alcohol use.   REVIEW OF SYSTEMS:  All systems were reviewed and are negative.   PHYSICAL EXAMINATION:  GENERAL:  A well-nourished, well-developed, age-  appropriate male in no acute distress.  CARDIOVASCULAR:  Regular rate and rhythm without obvious murmurs.  LUNGS:  Clear bilaterally.  ABDOMEN:  Soft, nontender, nondistended without abdominal masses.   IMPRESSION:  Clinically localized prostate cancer.   PLAN:  Mr. Krohn will undergo a robotic-assisted laparoscopic radical  prostatectomy.  He will then be admitted to the hospital for  routine  postoperative care.           ______________________________  Heloise Purpura, MD  Electronically Signed     LB/MEDQ  D:  09/05/2006  T:  09/06/2006  Job:  [id number]

## 2010-09-09 NOTE — Discharge Summary (Signed)
NAMEROMEL, DUMOND NO.:  [id number]   MEDICAL RECORD NO.:  [id number]          PATIENT TYPE:  INP   LOCATION:  1423                         FACILITY:  [facility]   PHYSICIAN:  Heloise Purpura, MD      DATE OF BIRTH:  [birth year]   DATE OF ADMISSION:  09/05/2006  DATE OF DISCHARGE:  09/06/2006                               DISCHARGE SUMMARY   HISTORY OF PRESENT ILLNESS:  For full details please see admission  history and physical.  Briefly, Mr. Cortez is a gentleman with recently  diagnosed clinically localized adenocarcinoma of the prostate.  After  discussing management options, the patient elected to proceed with  surgical removal via a robotic-assisted laparoscopic approach.   HOSPITAL COURSE:  On Sep 05, 2006, the patient was taken to the  operating room and underwent a robotic-assisted laparoscopic radical  prostatectomy.  He tolerated this procedure well and without  complications.  Postoperatively, he was able to be transferred to a  regular hospital room following recovery from anesthesia.  He was able  to begin ambulating the night of surgery and did this without  difficulty.  On postop day #1, he was noted to have continued excellent  urine output from his Foley catheter with minimal output from his pelvic  drain.  His pelvic drain was able to be removed.  His diet was advanced  to liquids and he tolerated this without problems throughout postop day  #1.  By the afternoon of postop day #1, he had met all discharge  criteria, his hemoglobin remained stable and he was able to be  discharged home.   DISPOSITION:  Home.   DISCHARGE MEDICATIONS:  1. The patient was instructed to refrain from any aspirin,      nonsteroidal anti-inflammatory drugs or herbal supplements.  2. Prescription to take Vicodin as needed for pain.  3. Colace as a stool softener.  4. Begin Cipro 1 day prior to his return visit for Foley catheter      removal.   ACTIVITY:  The patient  was instructed to be ambulatory, but specifically  told to refrain from any heavy lifting, strenuous activity or driving.   DIET:  He was told to gradually advance his diet once passing flatus.   SPECIAL INSTRUCTIONS:  He was also instructed on routine Foley catheter  care.   FOLLOW UP:  Mr. Blough will follow up in 1 week for removal of his Foley  catheter and to discuss his surgical pathology in detail.          ______________________________  Heloise Purpura, MD  Electronically Signed    LB/MEDQ  D:  09/07/2006  T:  09/08/2006  Job:  [id number]

## 2010-10-12 ENCOUNTER — Encounter (HOSPITAL_BASED_OUTPATIENT_CLINIC_OR_DEPARTMENT_OTHER): Payer: Federal, State, Local not specified - PPO | Admitting: Oncology

## 2010-10-12 ENCOUNTER — Other Ambulatory Visit: Payer: Self-pay | Admitting: Oncology

## 2010-10-12 DIAGNOSIS — R3129 Other microscopic hematuria: Secondary | ICD-10-CM

## 2010-10-12 DIAGNOSIS — Z8546 Personal history of malignant neoplasm of prostate: Secondary | ICD-10-CM

## 2010-10-12 DIAGNOSIS — D693 Immune thrombocytopenic purpura: Secondary | ICD-10-CM

## 2010-10-12 LAB — CBC WITH DIFFERENTIAL/PLATELET
BASO%: 0.5 % (ref 0.0–2.0)
EOS%: 6.3 % (ref 0.0–7.0)
HCT: 50 % — ABNORMAL HIGH (ref 38.4–49.9)
LYMPH%: 24.5 % (ref 14.0–49.0)
MCH: 33.3 pg (ref 27.2–33.4)
MCHC: 34.5 g/dL (ref 32.0–36.0)
MONO#: 0.7 10*3/uL (ref 0.1–0.9)
NEUT%: 60.4 % (ref 39.0–75.0)
Platelets: 122 10*3/uL — ABNORMAL LOW (ref 140–400)
RBC: 5.18 10*6/uL (ref 4.20–5.82)
WBC: 7.9 10*3/uL (ref 4.0–10.3)
lymph#: 1.9 10*3/uL (ref 0.9–3.3)

## 2010-10-12 LAB — MORPHOLOGY: RBC Comments: NORMAL

## 2011-04-12 ENCOUNTER — Other Ambulatory Visit: Payer: Self-pay | Admitting: Oncology

## 2011-04-12 ENCOUNTER — Other Ambulatory Visit (HOSPITAL_BASED_OUTPATIENT_CLINIC_OR_DEPARTMENT_OTHER): Payer: Federal, State, Local not specified - PPO | Admitting: Lab

## 2011-04-12 DIAGNOSIS — R3129 Other microscopic hematuria: Secondary | ICD-10-CM

## 2011-04-12 DIAGNOSIS — D693 Immune thrombocytopenic purpura: Secondary | ICD-10-CM

## 2011-04-12 DIAGNOSIS — Z8546 Personal history of malignant neoplasm of prostate: Secondary | ICD-10-CM

## 2011-04-12 LAB — CBC WITH DIFFERENTIAL/PLATELET
BASO%: 0.8 % (ref 0.0–2.0)
EOS%: 6.5 % (ref 0.0–7.0)
Eosinophils Absolute: 0.5 10*3/uL (ref 0.0–0.5)
MCH: 32.9 pg (ref 27.2–33.4)
MCHC: 36.4 g/dL — ABNORMAL HIGH (ref 32.0–36.0)
MCV: 90.3 fL (ref 79.3–98.0)
MONO%: 9.2 % (ref 0.0–14.0)
NEUT#: 4.5 10*3/uL (ref 1.5–6.5)
RBC: 5.59 10*6/uL (ref 4.20–5.82)
RDW: 12.6 % (ref 11.0–14.6)
nRBC: 0 % (ref 0–0)

## 2011-04-12 LAB — MORPHOLOGY: RBC Comments: NORMAL

## 2011-04-14 ENCOUNTER — Telehealth: Payer: Self-pay | Admitting: Oncology

## 2011-04-14 ENCOUNTER — Telehealth: Payer: Self-pay

## 2011-04-14 ENCOUNTER — Other Ambulatory Visit: Payer: Self-pay | Admitting: Oncology

## 2011-04-14 DIAGNOSIS — D693 Immune thrombocytopenic purpura: Secondary | ICD-10-CM

## 2011-04-14 NOTE — Telephone Encounter (Signed)
lmonvm adviisng the pt of his June 2013 appts

## 2011-04-14 NOTE — Telephone Encounter (Signed)
Called pt and left message on indetified vm, per Dr. Cleophas Dunker, re: labs 04/12/11- platelets stable at 126,000, WBC good, hgb still on high side, schedulers to contact him re: appt in June as they have planned.  Asked for pt to call back and confirm he rec'd this message.

## 2011-04-14 NOTE — Telephone Encounter (Signed)
lm on home # w/ appt info for 09/2011,also mailed/aom

## 2011-04-19 ENCOUNTER — Telehealth: Payer: Self-pay | Admitting: *Deleted

## 2011-04-19 NOTE — Telephone Encounter (Signed)
Spoke with pt re: CBC from 04/12/11. Informed him that platelets were stable, WBC normal and HGB still on the high side-- per Dr. Darrold Span. Informed him that schedulers will call with June follow up appt. He verbalized understanding.

## 2011-07-07 IMAGING — US US ABDOMEN COMPLETE
1 series · 14 of 25 positions shown · non-contrast
Comparison: None

CLINICAL DATA: Thrombocytopenia.  Question splenomegaly. Right
nephrectomy for trauma.  Hypertension.  Prostate carcinoma.

ABDOMINAL ULTRASOUND COMPLETE

[Series 1: us abdomen complete · 0.33mm/px · 14 of 73 slices shown]
[im 1/73]
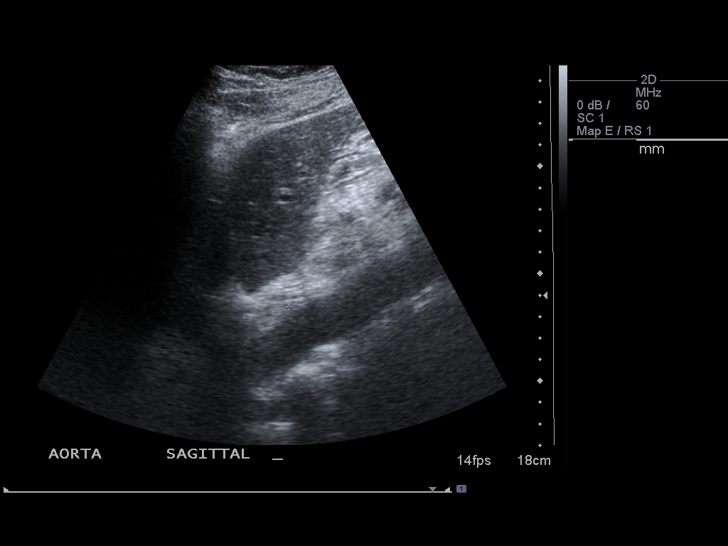
[im 7/73]
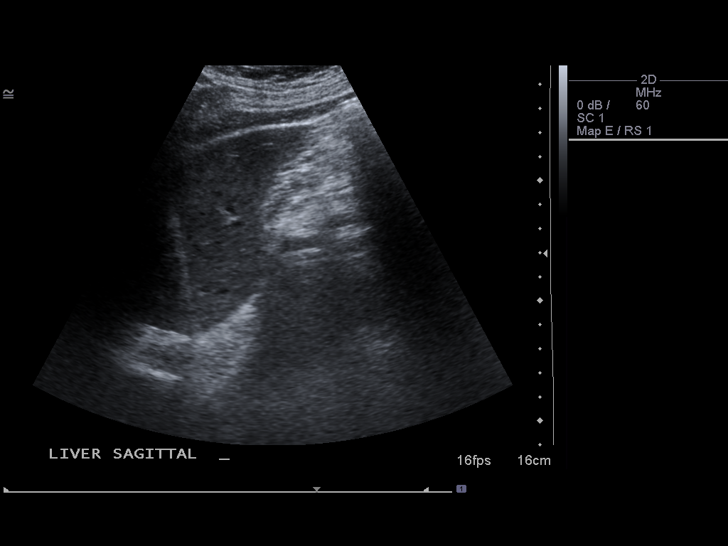
[im 13/73]
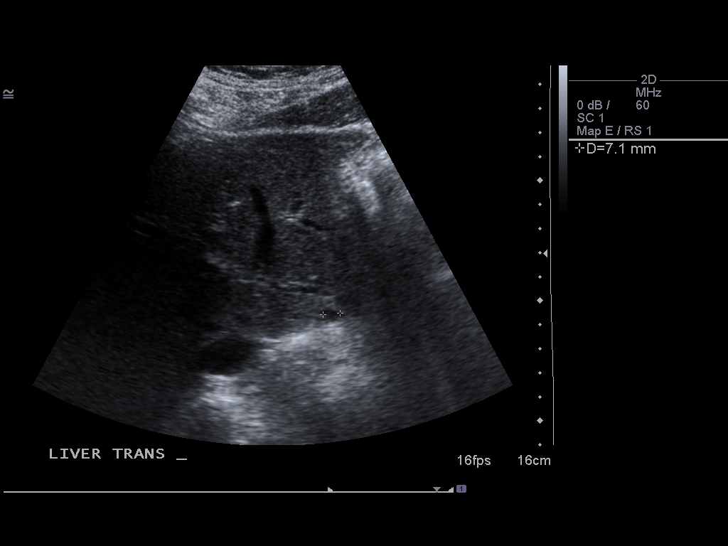
[im 19/73]
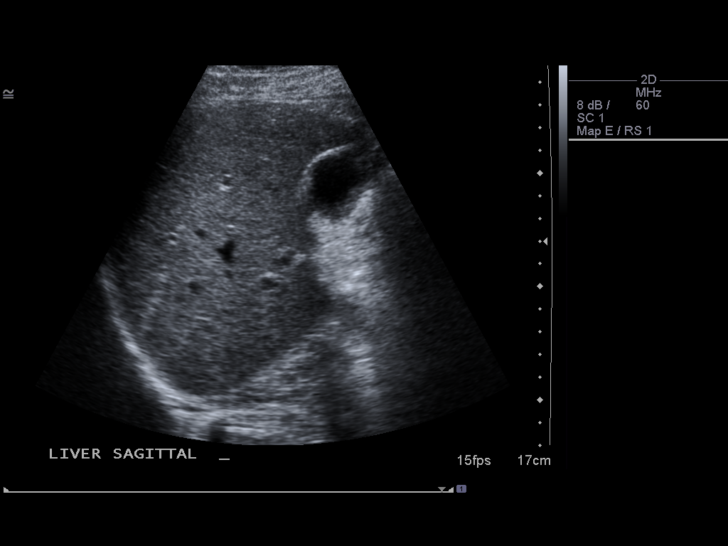
[im 25/73]
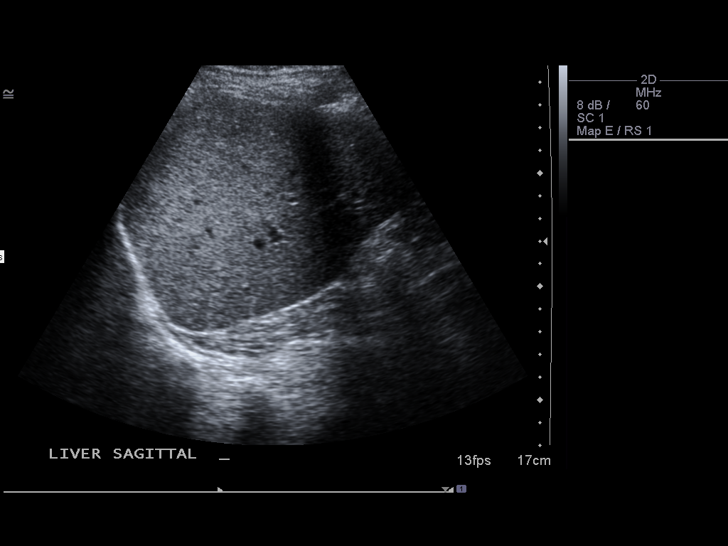
[im 28/73]
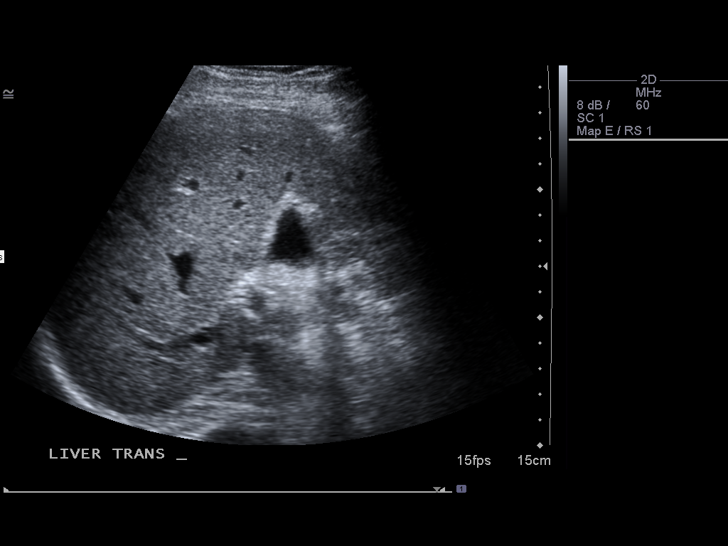
[im 34/73]
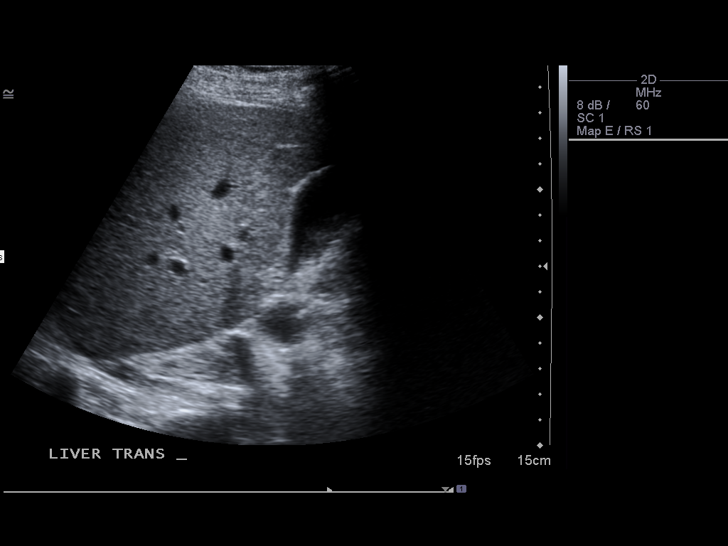
[im 40/73]
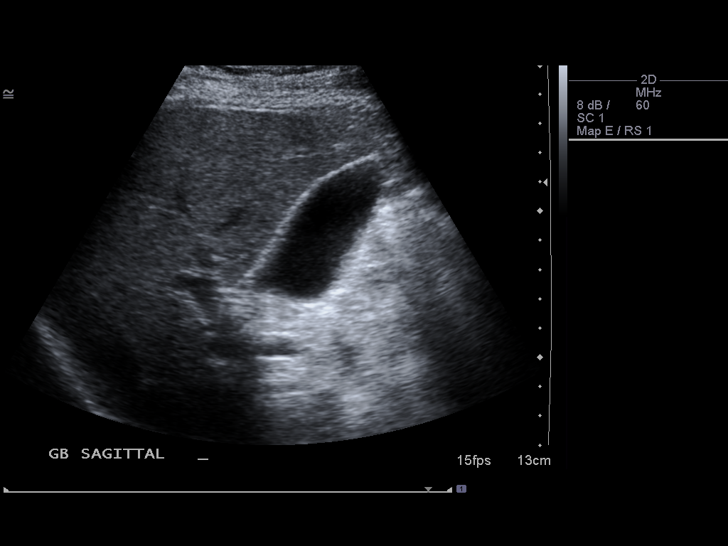
[im 46/73]
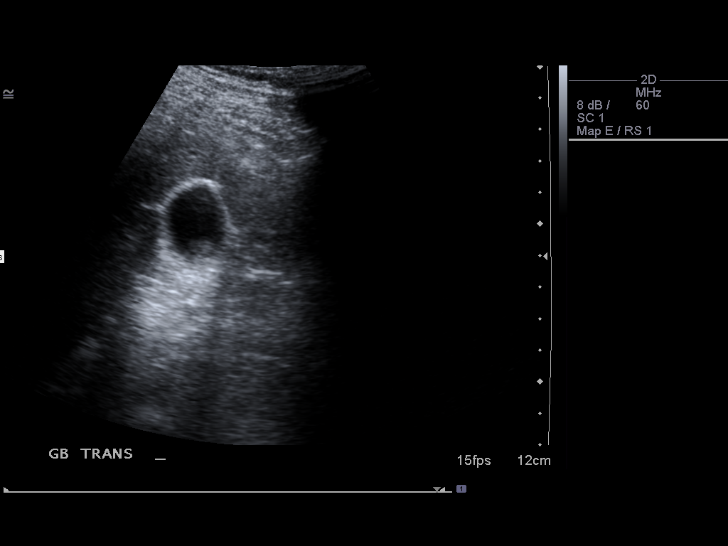
[im 49/73]
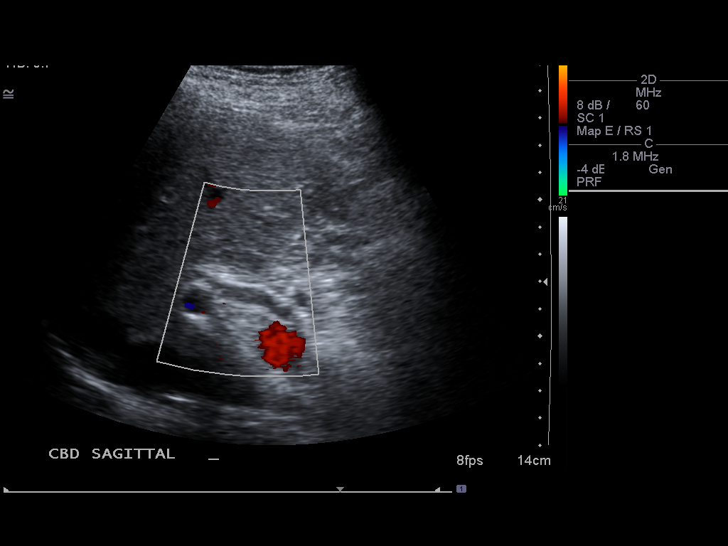
[im 55/73]
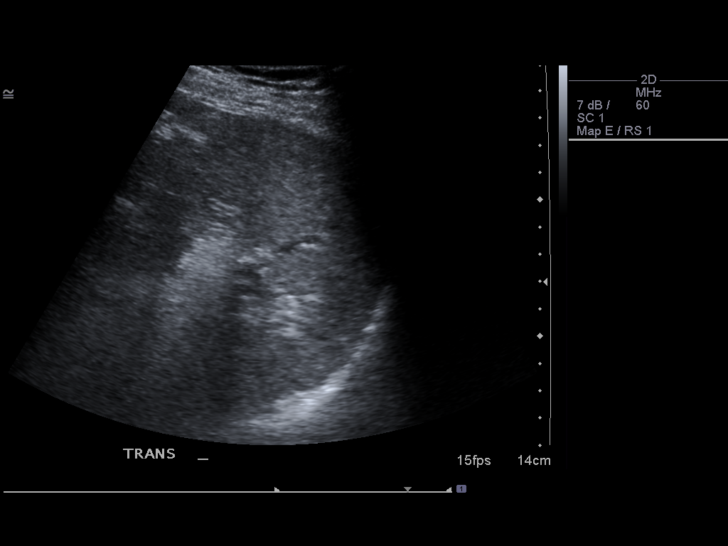
[im 61/73]
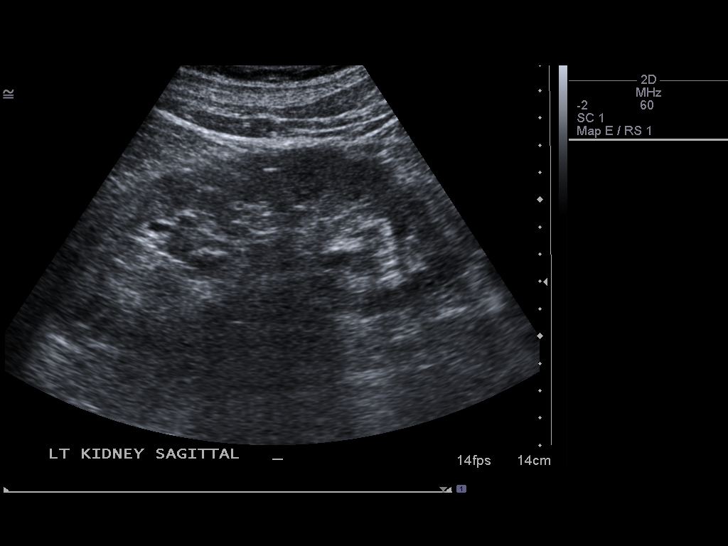
[im 67/73]
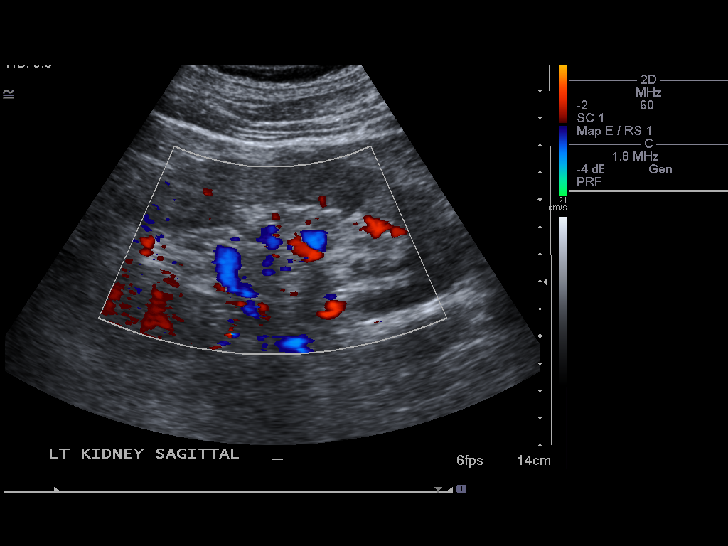
[im 73/73]
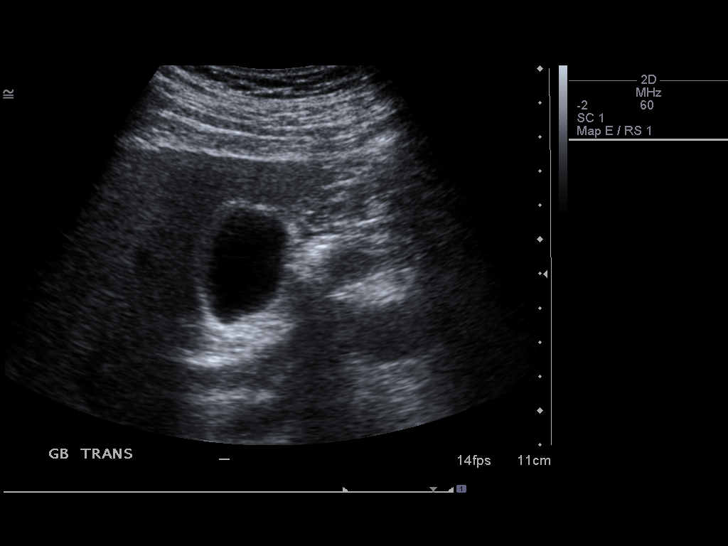

[14 of 25 positions shown; findings below may reference images not displayed]

FINDINGS: Gallbladder:  No gallstones, gallbladder wall thickening, or
pericholecystic fluid.  1.8 mm gallbladder wall thickness.

Common Bile Duct:  Within normal limits in caliber.  6.8 mm
diameter.

Liver:  Two liver cyst one measuring 0.6 x 0.5 x 0.7 cm and the
other measuring 1.3 x 0.8 x 1.2 cm. Within normal limits in
parenchymal echogenicity.

IVC:  Appears normal.

Pancreas:  Although the pancreas is difficult to visualize in its
entirety, no focal pancreatic abnormality is identified.

Spleen:  Within normal limits in size and echotexture.  9.3 cm
length.

Right kidney: Right nephrectomy

Left kidney:  Normal in size and parenchymal echogenicity.  No
evidence of mass or hydronephrosis.  15.5 cm length compatible with
compensatory hypertrophy.

Abdominal Aorta:  No aneurysm identified.  3.0 cm diameter
IMPRESSION: Right nephrectomy.  Normal gallbladder.  No biliary ductal
dilatation.  Liver cysts.   Splenic size upper normal.

## 2011-10-03 ENCOUNTER — Encounter: Payer: Self-pay | Admitting: *Deleted

## 2011-10-03 ENCOUNTER — Ambulatory Visit (HOSPITAL_BASED_OUTPATIENT_CLINIC_OR_DEPARTMENT_OTHER): Payer: Federal, State, Local not specified - PPO | Admitting: Oncology

## 2011-10-03 ENCOUNTER — Encounter: Payer: Self-pay | Admitting: Oncology

## 2011-10-03 ENCOUNTER — Ambulatory Visit: Payer: Federal, State, Local not specified - PPO | Admitting: Lab

## 2011-10-03 ENCOUNTER — Other Ambulatory Visit (HOSPITAL_BASED_OUTPATIENT_CLINIC_OR_DEPARTMENT_OTHER): Payer: Federal, State, Local not specified - PPO | Admitting: Lab

## 2011-10-03 ENCOUNTER — Telehealth: Payer: Self-pay | Admitting: Oncology

## 2011-10-03 VITALS — BP 132/81 | HR 79 | Temp 97.4°F | Ht 69.5 in | Wt 192.6 lb

## 2011-10-03 DIAGNOSIS — C61 Malignant neoplasm of prostate: Secondary | ICD-10-CM

## 2011-10-03 DIAGNOSIS — D693 Immune thrombocytopenic purpura: Secondary | ICD-10-CM

## 2011-10-03 DIAGNOSIS — Z8546 Personal history of malignant neoplasm of prostate: Secondary | ICD-10-CM

## 2011-10-03 DIAGNOSIS — Z905 Acquired absence of kidney: Secondary | ICD-10-CM | POA: Insufficient documentation

## 2011-10-03 LAB — MORPHOLOGY: PLT EST: DECREASED

## 2011-10-03 LAB — CBC WITH DIFFERENTIAL/PLATELET
Basophils Absolute: 0 10*3/uL (ref 0.0–0.1)
Eosinophils Absolute: 0.1 10*3/uL (ref 0.0–0.5)
HCT: 51.3 % — ABNORMAL HIGH (ref 38.4–49.9)
HGB: 18 g/dL — ABNORMAL HIGH (ref 13.0–17.1)
MONO#: 0.9 10*3/uL (ref 0.1–0.9)
NEUT#: 6.1 10*3/uL (ref 1.5–6.5)
NEUT%: 75.4 % — ABNORMAL HIGH (ref 39.0–75.0)
WBC: 8.2 10*3/uL (ref 4.0–10.3)
lymph#: 1 10*3/uL (ref 0.9–3.3)

## 2011-10-03 NOTE — Telephone Encounter (Signed)
Gavev pt appt for  June 2014 lab and MD, pt sent to lab today

## 2011-10-03 NOTE — Patient Instructions (Signed)
Call if needed prior to return visit 1 year.

## 2011-10-03 NOTE — Progress Notes (Signed)
OFFICE PROGRESS NOTE Date of Visit 10-03-11 Physicians: R.Harris, L.Borden  INTERVAL HISTORY:  Patient is seen, alone for visit, in follow up of his history of ITP, last CBC 6 months ago and last visit at this office a year ago. CBC 04-12-2011 was stable with plt 126k, WBC 7.2, ANC 4.5 and Hcb 18.4 with Hct 50.5. 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 His platelet count has generally run a little low since the acute ITP resolved. The silghtly elevated hgb/hct are long-standing and stable, possibly related to running. He had negative rheumatology evaluation around the ITP.  Past history is also notable for a Gleason 6 prostate cancer treated with robotic radical prostatectomy May 2008 by Dr Laverle Patter. Patient tells me that he is overdue for PSA and has not yet set up an appointment with urology, such that he will have PSA drawn here today and the result sent to Dr Laverle Patter.  Patient tells me that he may change PCP to an [place] location nearer to his home; he has no appointments upcoming with Dr Tiburcio Pea.  Mr.Kitagawa retired in Jan 2013 and has enjoyed being back on regular schedule and being able to take vacations. He is running > 3 miles at least 3x weekly. He has had no bleeding or unusual bruising, good energy, no pain, no respiratory or GI symptoms, no cardiac symptoms, no urologic symptoms. Remainder of 10 point Review of Systems negative. Objective:  Vital signs in last 24 hours:  BP 132/81  Pulse 79  Temp(Src) 97.4 F (36.3 C) (Oral)  Ht 5' 9.5" (1.765 m)  Wt 192 lb 9.6 oz (87.363 kg)  BMI 28.03 kg/m2 Easily mobile, very pleasant, looks comfortable  HEENT:mucous membranes moist, pharynx normal without lesions. PERRL, not icteric.  LymphaticsCervical, supraclavicular, and axillary nodes normal. Resp: clear to auscultation bilaterally and normal percussion bilaterally Cardio: regular rate and rhythm GI: soft, non-tender; bowel sounds normal; no masses,   no organomegaly. Scar right flank from remote nephrectomy unremarkable. Extremities: extremities normal, atraumatic, no cyanosis or edema Neuro:nonfocal Skin without rash, ecchymosis, petechiae   Lab Results:   Basename 10/03/11 1101  WBC 8.2  HGB 18.0*  HCT 51.3*  PLT 140  ANC 6.1 MCV 96.5 segs 75.4, lymphs 12.5 No WBC abnormalities on morphology BMET No chemistries done  PSA available after visit <0.01, same as last in this EMR 1 year ago Studies/Results:  No results found.  Medications: I have reviewed the patient's current medications.  Assessment/Plan:  1.history of ITP: platelet count into normal range today and clinically doing well. He prefers follow up at this office in 1 year and understands that he can call at any time if concerns prior to that. 2.history of gleason 6 prostate cancer: PSA as above and hopefully he will follow with Dr Laverle Patter as recommended 3.slight elevation in H/H as mentioned above, stable 4.post right nephrectomy for trauma remotely 5.following visit, I cannot find information about previous colonoscopy and we will try to contact him in that regard. Patient was in agreement with plan as above and had questions answered to his satisfaction.  Eion Timbrook P, MD   10/03/2011, 9:15 PM

## 2011-10-04 ENCOUNTER — Telehealth: Payer: Self-pay | Admitting: *Deleted

## 2011-10-04 ENCOUNTER — Other Ambulatory Visit: Payer: Self-pay | Admitting: Oncology

## 2011-10-04 DIAGNOSIS — Z1211 Encounter for screening for malignant neoplasm of colon: Secondary | ICD-10-CM

## 2011-10-04 NOTE — Telephone Encounter (Signed)
Scanned medication list. Multiple over the counter supplements taken by [name]

## 2011-10-04 NOTE — Telephone Encounter (Signed)
Called patient with PSA results. He would like a copy of results emailed to him at [email or web address]. He does need a referral to a GI for colonoscopy, last colonoscopy was ~ 8 years ago in [place], cannot recall MD. His wife's brother has a history of colon cancer, not his brother. Is there a way patient's can access their labs results from home? I told him that I will let him know if this is possible.

## 2011-10-04 NOTE — Telephone Encounter (Signed)
Informed patient that Dr Darrold Span has put in a referral to a GI. Schedulers will be in touch. Pt requested PSA and labs be mailed to him. Done 10/04/11.

## 2011-10-05 ENCOUNTER — Telehealth: Payer: Self-pay | Admitting: Oncology

## 2011-10-05 NOTE — Telephone Encounter (Signed)
Talked to pt, today, to make sure he has appt with GI, I offered to call [facility] again to make sure he gets appt, pt declined he will wait,pt not sure if he wants to see GI doctor. He will call [facility] himself

## 2011-10-13 ENCOUNTER — Ambulatory Visit: Payer: Federal, State, Local not specified - PPO | Admitting: Oncology

## 2012-02-06 ENCOUNTER — Telehealth: Payer: Self-pay | Admitting: *Deleted

## 2012-02-06 NOTE — Telephone Encounter (Signed)
Pt wanted to let Dr Cleophas Dunker know he is having pain in his hip when gets tired. Has an appointment with Dr Charlann Boxer on 02/16/12. Would you like information from that visit sent to you?

## 2012-02-06 NOTE — Telephone Encounter (Signed)
Called patient to let him know Dr Cleophas Dunker would be glad to see Dr Nilsa Nutting information if he would like to have it sent to her.

## 2012-04-11 ENCOUNTER — Inpatient Hospital Stay: Admit: 2012-04-11 | Payer: Self-pay | Admitting: Orthopedic Surgery

## 2012-04-11 SURGERY — ARTHROPLASTY, HIP, TOTAL, ANTERIOR APPROACH
Anesthesia: Choice | Site: Hip | Laterality: Right

## 2012-05-23 ENCOUNTER — Telehealth: Payer: Self-pay | Admitting: *Deleted

## 2012-05-23 NOTE — Telephone Encounter (Signed)
Pt called to say he has been having loose stools "on and off" for the last 3 weeks. Has been using Pepto bismol which seems to make stool more formed. States he is keeping self hydrated. Had 4-5 stools yesterday and has had 2 loose stools today. Does not have PCP and is wondering what to do.

## 2012-05-23 NOTE — Telephone Encounter (Signed)
Spoke with Tiana Loft, PA. Instructed patient to go to urgent care for evaluation of ongoing diarrhea. Informed pt they might want to run test on stool sample and it is important to keep self well hydrated with fluids, not just water.. Pt voiced understanding

## 2012-10-02 ENCOUNTER — Encounter: Payer: Self-pay | Admitting: Oncology

## 2012-10-02 ENCOUNTER — Ambulatory Visit (HOSPITAL_BASED_OUTPATIENT_CLINIC_OR_DEPARTMENT_OTHER): Payer: Federal, State, Local not specified - PPO | Admitting: Oncology

## 2012-10-02 ENCOUNTER — Other Ambulatory Visit (HOSPITAL_BASED_OUTPATIENT_CLINIC_OR_DEPARTMENT_OTHER): Payer: Federal, State, Local not specified - PPO | Admitting: Lab

## 2012-10-02 VITALS — BP 156/91 | HR 70 | Temp 97.7°F | Resp 18 | Ht 69.5 in | Wt 182.4 lb

## 2012-10-02 DIAGNOSIS — D693 Immune thrombocytopenic purpura: Secondary | ICD-10-CM

## 2012-10-02 LAB — CBC WITH DIFFERENTIAL/PLATELET
BASO%: 0.6 % (ref 0.0–2.0)
EOS%: 3.2 % (ref 0.0–7.0)
MCH: 33.6 pg — ABNORMAL HIGH (ref 27.2–33.4)
MCHC: 35.7 g/dL (ref 32.0–36.0)
MCV: 94.2 fL (ref 79.3–98.0)
MONO%: 11.7 % (ref 0.0–14.0)
RBC: 5.33 10*6/uL (ref 4.20–5.82)
RDW: 13.1 % (ref 11.0–14.6)
lymph#: 1.4 10*3/uL (ref 0.9–3.3)

## 2012-10-02 LAB — MORPHOLOGY: PLT EST: ADEQUATE

## 2012-10-02 NOTE — Patient Instructions (Signed)
Call if appointments needed in future

## 2012-10-02 NOTE — Progress Notes (Signed)
OFFICE PROGRESS NOTE   10/02/2012   Physicians: Tomma Rakers (PCP [facility]), Durene Romans (R.Harris),( L.Borden)   INTERVAL HISTORY:   Patient is seen, alone for visit, in yearly follow up of his history of ITP, on observation and doing well now.  History is of ITP diagnosed in Sept 2010 with platelets then <10k, treated with steroids. He had negative rheumatology evaluation around that time. History is also notable for Gleason 6 prostate cancer treated with robotic radical prostatectomy in May 2008 by Dr Laverle Patter. Patient is now established with a different primary care office in [place] (noted above), with CBCs and PSA followed there; he has not seen Dr Laverle Patter recently.  Patient is feeling very well overall, now walking 2 miles daily and weight down 10 lbs mostly with diet changes, as he has been found intolerant to gluten and milk products as well as other foods. He has intermittent discomfort in right hip such that he has not been running, was seen by Dr Durene Romans with degenerative changes found. He is on joint supplements etc from PCP which seem helpful.  He has had no infectious illness recently or over this past winter. No bleeding, bruising or petechiae. No SOB. Good appetite. No other pain. Bowels ok. Remainder of 10 point Review of Systems negative.  Objective:  Vital signs in last 24 hours:  BP 156/91  Pulse 70  Temp(Src) 97.7 F (36.5 C) (Oral)  Resp 18  Ht 5' 9.5" (1.765 m)  Wt 182 lb 6.4 oz (82.736 kg)  BMI 26.56 kg/m2  Easily mobile, looks comfortable, very pleasant as always.   HEENT:PERRLA, sclera clear, anicteric, oropharynx clear, no lesions and neck supple with midline trachea LymphaticsCervical, supraclavicular, and axillary nodes normal. Resp: clear to auscultation bilaterally and normal percussion bilaterally Cardio: regular rate and rhythm Back with well healed right flank scar from nephrectomy (related to  trauma) GI: soft, non-tender; bowel sounds normal; no masses,  no organomegaly. Spleen not palpable Extremities: extremities normal, atraumatic, no cyanosis or edema Neuro:nonfocal Skin without rash, ecchymoses, petechiae   Lab Results:  Results for orders placed in visit on 10/02/12  CBC WITH DIFFERENTIAL      Result Value Range   WBC 4.6  4.0 - 10.3 10e3/uL   NEUT# 2.5  1.5 - 6.5 10e3/uL   HGB 17.9 (*) 13.0 - 17.1 g/dL   HCT 40.9 (*) 81.1 - 91.4 %   Platelets 147  140 - 400 10e3/uL   MCV 94.2  79.3 - 98.0 fL   MCH 33.6 (*) 27.2 - 33.4 pg   MCHC 35.7  32.0 - 36.0 g/dL   RBC 7.82  9.56 - 2.13 10e6/uL   RDW 13.1  11.0 - 14.6 %   lymph# 1.4  0.9 - 3.3 10e3/uL   MONO# 0.5  0.1 - 0.9 10e3/uL   Eosinophils Absolute 0.2  0.0 - 0.5 10e3/uL   Basophils Absolute 0.0  0.0 - 0.1 10e3/uL   NEUT% 55.1  39.0 - 75.0 %   LYMPH% 29.4  14.0 - 49.0 %   MONO% 11.7  0.0 - 14.0 %   EOS% 3.2  0.0 - 7.0 %   BASO% 0.6  0.0 - 2.0 %     Studies/Results:  No results found.  Medications: I have reviewed the patient's current medications, primarily supplements.  We have discussed scheduled follow up at this office vs prn as he is doing very well now and is established with PCP including regular follow up  visits. He is comfortable changing follow up to prn here.  Assessment/Plan:  1.history of ITP: platelet count into normal range today and clinically doing well. He understands that he can be seen back at this office at any time if he or other MDs have concerns, however we have not scheduled regular return visit. 2.history of gleason 6 prostate cancer: PSA reportedly done recently by PCP 3.slight elevation in H/H stable, previously thought likely related to exercise 4.post right nephrectomy for trauma remotely 5. Food intolerances recently diagnosed, including gluten and milk products   Patient had questions answered to his satisfaction and is in agreement with plan  Curtis Uriarte P, MD    10/02/2012, 9:59 AM

## 2012-11-27 ENCOUNTER — Other Ambulatory Visit: Payer: Self-pay

## 2013-02-27 ENCOUNTER — Other Ambulatory Visit: Payer: Self-pay

## 2014-02-06 ENCOUNTER — Other Ambulatory Visit: Payer: Self-pay

## 2014-10-19 ENCOUNTER — Other Ambulatory Visit: Payer: Self-pay

## 2016-04-10 ENCOUNTER — Telehealth: Payer: Self-pay

## 2016-04-10 NOTE — Telephone Encounter (Signed)
Notes sent to scheduling.   

## 2016-05-15 ENCOUNTER — Ambulatory Visit: Payer: Federal, State, Local not specified - PPO | Admitting: Cardiovascular Disease

## 2016-05-18 ENCOUNTER — Ambulatory Visit (INDEPENDENT_AMBULATORY_CARE_PROVIDER_SITE_OTHER): Payer: Federal, State, Local not specified - PPO | Admitting: Cardiovascular Disease

## 2016-05-18 ENCOUNTER — Encounter: Payer: Self-pay | Admitting: Cardiovascular Disease

## 2016-05-18 ENCOUNTER — Encounter: Payer: Self-pay | Admitting: Nurse Practitioner

## 2016-05-18 VITALS — BP 140/100 | HR 145 | Ht 69.5 in | Wt 190.8 lb

## 2016-05-18 DIAGNOSIS — I483 Typical atrial flutter: Secondary | ICD-10-CM | POA: Insufficient documentation

## 2016-05-18 MED ORDER — APIXABAN 5 MG PO TABS: 5.0000 mg | ORAL_TABLET | Freq: Two times a day (BID) | ORAL | 11 refills | Status: DC

## 2016-05-18 MED ORDER — METOPROLOL TARTRATE 25 MG PO TABS: 25.0000 mg | ORAL_TABLET | Freq: Two times a day (BID) | ORAL | 11 refills | Status: AC

## 2016-05-18 NOTE — Progress Notes (Signed)
Cardiology Office Note   Date:  05/18/2016   ID:  [name], DOB 28-Apr-1945, MRN [id number]  PCP:  Zane Herald, MD  Cardiologist:   Mertie Moores, MD   Chief Complaint  Patient presents with  . Atrial Flutter   Problem List 1. Polycythemia 2. Atrial flutter 3. R nephrectomy - 1975, accident while playing softball   History of Present Illness: [name] is a 71 y.o. male who presents for Further evaluation and management of his atrial flutter.   was diagnosed with polycythemia. He donates blood every 21 days. His fast heart rate was found during one of the blood donations  He is basically asymptomatic.   Cannot tell that his HR is fast. HR is not always that fast.   Used to get regular exercise - limited by hip pain Used to run.   Cycling bothers his hip more.   Not limited by CP or dyspnea.     Past Medical History:  Diagnosis Date  . 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 Prostate cancer ([place]) 06-11-2006    Past Surgical History:  Procedure Laterality Date  . NEPHRECTOMY  1995   right nephrectomy after tramua     Current Outpatient Prescriptions  Medication Sig Dispense Refill  . diltiazem (CARDIZEM CD) 180 MG 24 hr capsule Take 180 mg by mouth daily.    Marland Kitchen ibuprofen (ADVIL,MOTRIN) 200 MG tablet Take 200 mg by mouth as needed.    . Multiple Vitamin (MULTIVITAMIN) capsule Take 1 capsule by mouth daily.    Marland Kitchen NATURE-THROID 32.5 MG tablet Take 1 tablet by mouth 2 (two) times daily.    Marland Kitchen OVER THE COUNTER MEDICATION Take 1 tablet by mouth daily. Rosemary Leaf    . OVER THE COUNTER MEDICATION Aloe Vera juice    . OVER THE COUNTER MEDICATION Take 1 tablet by mouth daily. Saffron 1 daily    . OVER THE COUNTER MEDICATION Take 4 mg by mouth daily. Astaxanthin 4mg  daily    . OVER THE COUNTER MEDICATION Take 1 tablet by mouth daily. Joint formula 1 daily    . OVER THE COUNTER MEDICATION Take 2 tablets by mouth daily. Whole Food MultiVitamin  Plus  Vital Minerals    . OVER THE COUNTER MEDICATION High potency dry Vitamin d-3 1,000mg  or 1 gr.    Marland Kitchen OVER THE COUNTER MEDICATION L-Proline 500 mg (joints)    . OVER THE COUNTER MEDICATION Glycine 1000 mg (kidney)    . OVER THE COUNTER MEDICATION Nicinamide 500 mg; Vitamin B-3 1000 mg (cholesterol)    . OVER THE COUNTER MEDICATION DEHA 25 mg (dissolve in mouth)    . OVER THE COUNTER MEDICATION Pregnenolone 100 mg    . OVER THE COUNTER MEDICATION Vitamin K 150 mcg( Super K w /K2 complex)    . OVER THE COUNTER MEDICATION Complete Probiotics 800 mg (digestion)    . OVER THE COUNTER MEDICATION HiAcives Tart Cherry Extract 465 mg    . OVER THE COUNTER MEDICATION AlgaeCal Plus 2 tabs daily     No current facility-administered medications for this visit.     Allergies:   Gluten meal; Lactose intolerance (gi); and Morphine sulfate    Social History:  The patient  reports that he has never smoked. He has never used smokeless tobacco.   Family History:  The patient's family history includes Heart failure in his father and mother; Rheumatic fever in his brother.    ROS:  Please see the history of present illness.  Review of Systems: Constitutional:  denies fever, chills, diaphoresis, appetite change and fatigue.  HEENT: denies photophobia, eye pain, redness, hearing loss, ear pain, congestion, sore throat, rhinorrhea, sneezing, neck pain, neck stiffness and tinnitus.  Respiratory: denies SOB, DOE, cough, chest tightness, and wheezing.  Cardiovascular: denies chest pain, palpitations and leg swelling.  Gastrointestinal: denies nausea, vomiting, abdominal pain, diarrhea, constipation, blood in stool.  Genitourinary: denies dysuria, urgency, frequency, hematuria, flank pain and difficulty urinating.  Musculoskeletal: denies  myalgias, back pain, joint swelling, arthralgias and gait problem.   Skin: denies pallor, rash and wound.  Neurological: denies dizziness, seizures, syncope, weakness,  light-headedness, numbness and headaches.   Hematological: denies adenopathy, easy bruising, personal or family bleeding history.  Psychiatric/ Behavioral: denies suicidal ideation, mood changes, confusion, nervousness, sleep disturbance and agitation.       All other systems are reviewed and negative.    PHYSICAL EXAM: VS:  BP (!) 140/100 (BP Location: Left Arm, Patient Position: Sitting, Cuff Size: Normal)   Pulse (!) 145   Ht 5' 9.5" (1.765 m)   Wt 190 lb 12.8 oz (86.5 kg)   BMI 27.77 kg/m  , BMI Body mass index is 27.77 kg/m. GEN: Well nourished, well developed, in no acute distress  HEENT: normal  Neck: no JVD, carotid bruits, or masses Cardiac: RRR; no murmurs, rubs, or gallops,no edema  Respiratory:  clear to auscultation bilaterally, normal work of breathing GI: soft, nontender, nondistended, + BS MS: no deformity or atrophy  Skin: warm and dry, no rash Neuro:  Strength and sensation are intact Psych: normal   EKG:  EKG is ordered today. The ekg ordered today demonstrates Atrial flutter with V rate of 145.     Recent Labs: No results found for requested labs within last 8760 hours.    Lipid Panel No results found for: CHOL, TRIG, HDL, CHOLHDL, VLDL, LDLCALC, LDLDIRECT    Wt Readings from Last 3 Encounters:  05/18/16 190 lb 12.8 oz (86.5 kg)  10/02/12 182 lb 6.4 oz (82.7 kg)  10/03/11 192 lb 9.6 oz (87.4 kg)      Other studies Reviewed: Additional studies/ records that were reviewed today include: . Review of the above records demonstrates:    ASSESSMENT AND PLAN:  1.  Atrial Flutter:   CHADS 2VASC = 2 ( AGE, HTN, )  We had long discussion about his risk of stroke. He's very hesitant to start a blood thinner I explained that his risk was far greater if he does not take an anticoagulant  We'll double check labs today. We'll check a TSH, complete metabolic profile, CBC. We will schedule him for an echocardiogram. We will also schedule him for a  transesophageal echocardiogram/cardioversion next week. He prefers Tuesday  if possible.  We briefly discussed long-term treatment options such as atrial flutter ablation.  I'll see him in one to 2 months for follow-up visit.   Current medicines are reviewed at length with the patient today.  The patient does not have concerns regarding medicines.  Labs/ tests ordered today include:  No orders of the defined types were placed in this encounter.    Disposition:   FU with me in 1-2 months      Mertie Moores, MD  05/18/2016 9:19 AM    [facility] [place], [place], [place]  [address] Phone: [phone]; Fax: [phone]

## 2016-05-18 NOTE — Patient Instructions (Signed)
Medication Instructions:  START Metoprolol (Lopressor) 25 mg twice daily START Eliquis (Apixaban) 5 mg twice daily   Labwork: TODAY - complete metabolic panel, cholesterol, CBC, TSH  Your physician recommends that you return for lab work in: 1 month at next office visit for CBC, basic metabolic panel   Testing/Procedures: Your physician has requested that you have an echocardiogram. Echocardiography is a painless test that uses sound waves to create images of your heart. It provides your doctor with information about the size and shape of your heart and how well your heart's chambers and valves are working. This procedure takes approximately one hour. There are no restrictions for this procedure.  Your physician has requested that you have a TEE/Cardioversion. During a TEE, sound waves are used to create images of your heart. It provides your doctor with information about the size and shape of your heart and how well your heart's chambers and valves are working. In this test, a transducer is attached to the end of a flexible tube that is guided down you throat and into your esophagus (the tube leading from your mouth to your stomach) to get a more detailed image of your heart. Once the TEE has determined that a blood clot is not present, the cardioversion begins. Electrical Cardioversion uses a jolt of electricity to your heart either through paddles or wired patches attached to your chest. This is a controlled, usually prescheduled, procedure. This procedure is done at the hospital and you are not awake during the procedure. You usually go home the day of the procedure. Please see the instruction sheet given to you today for more information.   Follow-Up: Your physician recommends that you schedule a follow-up appointment in: 1 month with Dr. Acie Fredrickson   If you need a refill on your cardiac medications before your next appointment, please call your pharmacy.   Thank you for choosing [facility]! Christen Bame, RN [phone]

## 2016-05-19 LAB — CBC WITH DIFFERENTIAL/PLATELET
BASOS ABS: 0.1 10*3/uL (ref 0.0–0.2)
Basos: 1 %
EOS (ABSOLUTE): 0.5 10*3/uL — ABNORMAL HIGH (ref 0.0–0.4)
Eos: 5 %
HEMOGLOBIN: 18.7 g/dL — AB (ref 13.0–17.7)
Hematocrit: 52.2 % — ABNORMAL HIGH (ref 37.5–51.0)
Immature Grans (Abs): 0.1 10*3/uL (ref 0.0–0.1)
Immature Granulocytes: 1 %
LYMPHS ABS: 1.4 10*3/uL (ref 0.7–3.1)
Lymphs: 16 %
MCH: 31.9 pg (ref 26.6–33.0)
MCHC: 35.8 g/dL — AB (ref 31.5–35.7)
MCV: 89 fL (ref 79–97)
MONOCYTES: 11 %
MONOS ABS: 1 10*3/uL — AB (ref 0.1–0.9)
NEUTROS ABS: 5.9 10*3/uL (ref 1.4–7.0)
Neutrophils: 66 %
Platelets: 106 10*3/uL — ABNORMAL LOW (ref 150–379)
RBC: 5.86 x10E6/uL — AB (ref 4.14–5.80)
RDW: 17.1 % — AB (ref 12.3–15.4)
WBC: 8.8 10*3/uL (ref 3.4–10.8)

## 2016-05-19 LAB — LIPID PANEL
CHOLESTEROL TOTAL: 148 mg/dL (ref 100–199)
Chol/HDL Ratio: 3.1 ratio units (ref 0.0–5.0)
HDL: 47 mg/dL (ref >39)
LDL CALC: 78 mg/dL (ref 0–99)
Triglycerides: 116 mg/dL (ref 0–149)
VLDL CHOLESTEROL CAL: 23 mg/dL (ref 5–40)

## 2016-05-19 LAB — TSH: TSH: 4.22 u[IU]/mL (ref 0.450–4.500)

## 2016-05-19 LAB — COMPREHENSIVE METABOLIC PANEL
A/G RATIO: 1.5 (ref 1.2–2.2)
ALBUMIN: 4.3 g/dL (ref 3.5–4.8)
ALK PHOS: 100 IU/L (ref 39–117)
ALT: 27 IU/L (ref 0–44)
AST: 25 IU/L (ref 0–40)
BILIRUBIN TOTAL: 1 mg/dL (ref 0.0–1.2)
BUN / CREAT RATIO: 18 (ref 10–24)
BUN: 21 mg/dL (ref 8–27)
CHLORIDE: 100 mmol/L (ref 96–106)
CO2: 22 mmol/L (ref 18–29)
Calcium: 9.6 mg/dL (ref 8.6–10.2)
Creatinine, Ser: 1.18 mg/dL (ref 0.76–1.27)
GFR calc non Af Amer: 61 mL/min/{1.73_m2} (ref >59)
GFR, EST AFRICAN AMERICAN: 71 mL/min/{1.73_m2} (ref >59)
GLOBULIN, TOTAL: 2.9 g/dL (ref 1.5–4.5)
GLUCOSE: 114 mg/dL — AB (ref 65–99)
POTASSIUM: 4.4 mmol/L (ref 3.5–5.2)
SODIUM: 142 mmol/L (ref 134–144)
TOTAL PROTEIN: 7.2 g/dL (ref 6.0–8.5)

## 2016-05-22 ENCOUNTER — Other Ambulatory Visit: Payer: Self-pay

## 2016-05-22 ENCOUNTER — Telehealth: Payer: Self-pay | Admitting: Cardiovascular Disease

## 2016-05-22 ENCOUNTER — Other Ambulatory Visit: Payer: Self-pay | Admitting: Cardiovascular Disease

## 2016-05-22 ENCOUNTER — Ambulatory Visit (HOSPITAL_COMMUNITY): Payer: Federal, State, Local not specified - PPO | Attending: Cardiovascular Disease

## 2016-05-22 DIAGNOSIS — I4892 Unspecified atrial flutter: Secondary | ICD-10-CM | POA: Diagnosis present

## 2016-05-22 DIAGNOSIS — I483 Typical atrial flutter: Secondary | ICD-10-CM | POA: Diagnosis not present

## 2016-05-22 DIAGNOSIS — I340 Nonrheumatic mitral (valve) insufficiency: Secondary | ICD-10-CM | POA: Insufficient documentation

## 2016-05-22 DIAGNOSIS — I517 Cardiomegaly: Secondary | ICD-10-CM | POA: Diagnosis not present

## 2016-05-22 NOTE — Telephone Encounter (Signed)
New Message:   Please call,pt says he have some questions. He said you said to call if he had any questions.

## 2016-05-22 NOTE — Telephone Encounter (Signed)
Spoke with patient who called to ask if he should proceed with TEE/DCCV tomorrow. He states when he spoke with someone from the hospital he c/o sinus drainage and they advised he call our office for advice. He denies aches, fever, chills, cough, cold or flu symptoms. He has an occasional cough r/t sinus drainage that he states has been intermittent for several weeks.  He states he drinks aloe vera juice for this problem. He denies trouble sleeping or having periods of severe coughing. I explained the procedure that will be done tomorrow and he states he does not think he will have any problems with having that procedure. I advised that he may keep appointment for tomorrow as long as he does not develop symptoms of fever, aches, chills, or generally feeling "worn down" or flu-like symptoms. He verbalized understanding and agreement.  I reviewed echo results and scheduled him for follow-up with Dr. Acie Fredrickson on 2/14. He thanked me for the call.

## 2016-05-23 ENCOUNTER — Ambulatory Visit (HOSPITAL_COMMUNITY): Payer: Federal, State, Local not specified - PPO | Admitting: Certified Registered Nurse Anesthetist

## 2016-05-23 ENCOUNTER — Other Ambulatory Visit: Payer: Self-pay

## 2016-05-23 ENCOUNTER — Ambulatory Visit (HOSPITAL_BASED_OUTPATIENT_CLINIC_OR_DEPARTMENT_OTHER): Payer: Federal, State, Local not specified - PPO

## 2016-05-23 ENCOUNTER — Ambulatory Visit (HOSPITAL_COMMUNITY)
Admission: RE | Admit: 2016-05-23 | Discharge: 2016-05-23 | Disposition: A | Discharge status: Home / Self Care | Payer: Federal, State, Local not specified - PPO | Source: Ambulatory Visit | Attending: Cardiovascular Disease | Admitting: Cardiovascular Disease

## 2016-05-23 ENCOUNTER — Encounter (HOSPITAL_COMMUNITY): Payer: Self-pay | Admitting: Cardiovascular Disease

## 2016-05-23 ENCOUNTER — Encounter (HOSPITAL_COMMUNITY)
Admission: RE | Disposition: A | Discharge status: Home / Self Care | Payer: Self-pay | Source: Ambulatory Visit | Attending: Cardiovascular Disease

## 2016-05-23 DIAGNOSIS — D751 Secondary polycythemia: Secondary | ICD-10-CM | POA: Diagnosis not present

## 2016-05-23 DIAGNOSIS — Z8546 Personal history of malignant neoplasm of prostate: Secondary | ICD-10-CM | POA: Diagnosis not present

## 2016-05-23 DIAGNOSIS — I483 Typical atrial flutter: Secondary | ICD-10-CM

## 2016-05-23 DIAGNOSIS — I340 Nonrheumatic mitral (valve) insufficiency: Secondary | ICD-10-CM | POA: Diagnosis not present

## 2016-05-23 DIAGNOSIS — I4892 Unspecified atrial flutter: Secondary | ICD-10-CM | POA: Insufficient documentation

## 2016-05-23 DIAGNOSIS — Z87828 Personal history of other (healed) physical injury and trauma: Secondary | ICD-10-CM | POA: Insufficient documentation

## 2016-05-23 DIAGNOSIS — Z905 Acquired absence of kidney: Secondary | ICD-10-CM | POA: Diagnosis not present

## 2016-05-23 HISTORY — PX: CARDIOVERSION: SHX1299

## 2016-05-23 HISTORY — PX: TEE WITHOUT CARDIOVERSION: SHX5443

## 2016-05-23 SURGERY — CARDIOVERSION
Anesthesia: General

## 2016-05-23 MED ORDER — BUTAMBEN-TETRACAINE-BENZOCAINE 2-2-14 % EX AERO
INHALATION_SPRAY | CUTANEOUS | Status: DC | PRN
  Administered 2016-05-23: 2 via TOPICAL

## 2016-05-23 MED ORDER — SODIUM CHLORIDE 0.9 % IV SOLN
INTRAVENOUS | Status: DC
  Administered 2016-05-23 (×3): via INTRAVENOUS

## 2016-05-23 MED ORDER — PROPOFOL 500 MG/50ML IV EMUL
INTRAVENOUS | Status: DC | PRN
  Administered 2016-05-23: 100 ug/kg/min via INTRAVENOUS

## 2016-05-23 MED ORDER — PROPOFOL 10 MG/ML IV BOLUS
INTRAVENOUS | Status: DC | PRN
  Administered 2016-05-23 (×2): 20 mg via INTRAVENOUS

## 2016-05-23 NOTE — Anesthesia Preprocedure Evaluation (Signed)
Anesthesia Evaluation  Patient identified by MRN, date of birth, ID band Patient awake    Reviewed: Allergy & Precautions, NPO status , Patient's Chart, lab work & pertinent test results  Airway Mallampati: II       Dental  (+) Teeth Intact   Pulmonary neg pulmonary ROS,    breath sounds clear to auscultation       Cardiovascular + dysrhythmias Atrial Fibrillation  Rhythm:Irregular     Neuro/Psych negative neurological ROS  negative psych ROS   GI/Hepatic negative GI ROS, Neg liver ROS,   Endo/Other  negative endocrine ROS  Renal/GU negative Renal ROS  negative genitourinary   Musculoskeletal negative musculoskeletal ROS (+)   Abdominal   Peds negative pediatric ROS (+)  Hematology negative hematology ROS (+)   Anesthesia Other Findings   Reproductive/Obstetrics negative OB ROS                             Anesthesia Physical Anesthesia Plan  ASA: III  Anesthesia Plan: General   Post-op Pain Management:    Induction: Intravenous  Airway Management Planned: Natural Airway  Additional Equipment:   Intra-op Plan:   Post-operative Plan:   Informed Consent: I have reviewed the patients History and Physical, chart, labs and discussed the procedure including the risks, benefits and alternatives for the proposed anesthesia with the patient or authorized representative who has indicated his/her understanding and acceptance.     Plan Discussed with: CRNA  Anesthesia Plan Comments:         Anesthesia Quick Evaluation

## 2016-05-23 NOTE — H&P (View-Only) (Signed)
Cardiology Office Note   Date:  05/18/2016   ID:  [name], DOB 1945-11-11, MRN [id number]  PCP:  Zane Herald, MD  Cardiologist:   Mertie Moores, MD   Chief Complaint  Patient presents with  . Atrial Flutter   Problem List 1. Polycythemia 2. Atrial flutter 3. R nephrectomy - 1975, accident while playing softball   History of Present Illness: [name] is a 71 y.o. male who presents for Further evaluation and management of his atrial flutter.   was diagnosed with polycythemia. He donates blood every 21 days. His fast heart rate was found during one of the blood donations  He is basically asymptomatic.   Cannot tell that his HR is fast. HR is not always that fast.   Used to get regular exercise - limited by hip pain Used to run.   Cycling bothers his hip more.   Not limited by CP or dyspnea.     Past Medical History:  Diagnosis Date  . 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 Prostate cancer ([place]) 06-11-2006    Past Surgical History:  Procedure Laterality Date  . NEPHRECTOMY  1995   right nephrectomy after tramua     Current Outpatient Prescriptions  Medication Sig Dispense Refill  . diltiazem (CARDIZEM CD) 180 MG 24 hr capsule Take 180 mg by mouth daily.    Marland Kitchen ibuprofen (ADVIL,MOTRIN) 200 MG tablet Take 200 mg by mouth as needed.    . Multiple Vitamin (MULTIVITAMIN) capsule Take 1 capsule by mouth daily.    Marland Kitchen NATURE-THROID 32.5 MG tablet Take 1 tablet by mouth 2 (two) times daily.    Marland Kitchen OVER THE COUNTER MEDICATION Take 1 tablet by mouth daily. Rosemary Leaf    . OVER THE COUNTER MEDICATION Aloe Vera juice    . OVER THE COUNTER MEDICATION Take 1 tablet by mouth daily. Saffron 1 daily    . OVER THE COUNTER MEDICATION Take 4 mg by mouth daily. Astaxanthin 4mg  daily    . OVER THE COUNTER MEDICATION Take 1 tablet by mouth daily. Joint formula 1 daily    . OVER THE COUNTER MEDICATION Take 2 tablets by mouth daily. Whole Food MultiVitamin  Plus  Vital Minerals    . OVER THE COUNTER MEDICATION High potency dry Vitamin d-3 1,000mg  or 1 gr.    Marland Kitchen OVER THE COUNTER MEDICATION L-Proline 500 mg (joints)    . OVER THE COUNTER MEDICATION Glycine 1000 mg (kidney)    . OVER THE COUNTER MEDICATION Nicinamide 500 mg; Vitamin B-3 1000 mg (cholesterol)    . OVER THE COUNTER MEDICATION DEHA 25 mg (dissolve in mouth)    . OVER THE COUNTER MEDICATION Pregnenolone 100 mg    . OVER THE COUNTER MEDICATION Vitamin K 150 mcg( Super K w /K2 complex)    . OVER THE COUNTER MEDICATION Complete Probiotics 800 mg (digestion)    . OVER THE COUNTER MEDICATION HiAcives Tart Cherry Extract 465 mg    . OVER THE COUNTER MEDICATION AlgaeCal Plus 2 tabs daily     No current facility-administered medications for this visit.     Allergies:   Gluten meal; Lactose intolerance (gi); and Morphine sulfate    Social History:  The patient  reports that he has never smoked. He has never used smokeless tobacco.   Family History:  The patient's family history includes Heart failure in his father and mother; Rheumatic fever in his brother.    ROS:  Please see the history of present illness.  Review of Systems: Constitutional:  denies fever, chills, diaphoresis, appetite change and fatigue.  HEENT: denies photophobia, eye pain, redness, hearing loss, ear pain, congestion, sore throat, rhinorrhea, sneezing, neck pain, neck stiffness and tinnitus.  Respiratory: denies SOB, DOE, cough, chest tightness, and wheezing.  Cardiovascular: denies chest pain, palpitations and leg swelling.  Gastrointestinal: denies nausea, vomiting, abdominal pain, diarrhea, constipation, blood in stool.  Genitourinary: denies dysuria, urgency, frequency, hematuria, flank pain and difficulty urinating.  Musculoskeletal: denies  myalgias, back pain, joint swelling, arthralgias and gait problem.   Skin: denies pallor, rash and wound.  Neurological: denies dizziness, seizures, syncope, weakness,  light-headedness, numbness and headaches.   Hematological: denies adenopathy, easy bruising, personal or family bleeding history.  Psychiatric/ Behavioral: denies suicidal ideation, mood changes, confusion, nervousness, sleep disturbance and agitation.       All other systems are reviewed and negative.    PHYSICAL EXAM: VS:  BP (!) 140/100 (BP Location: Left Arm, Patient Position: Sitting, Cuff Size: Normal)   Pulse (!) 145   Ht 5' 9.5" (1.765 m)   Wt 190 lb 12.8 oz (86.5 kg)   BMI 27.77 kg/m  , BMI Body mass index is 27.77 kg/m. GEN: Well nourished, well developed, in no acute distress  HEENT: normal  Neck: no JVD, carotid bruits, or masses Cardiac: RRR; no murmurs, rubs, or gallops,no edema  Respiratory:  clear to auscultation bilaterally, normal work of breathing GI: soft, nontender, nondistended, + BS MS: no deformity or atrophy  Skin: warm and dry, no rash Neuro:  Strength and sensation are intact Psych: normal   EKG:  EKG is ordered today. The ekg ordered today demonstrates Atrial flutter with V rate of 145.     Recent Labs: No results found for requested labs within last 8760 hours.    Lipid Panel No results found for: CHOL, TRIG, HDL, CHOLHDL, VLDL, LDLCALC, LDLDIRECT    Wt Readings from Last 3 Encounters:  05/18/16 190 lb 12.8 oz (86.5 kg)  10/02/12 182 lb 6.4 oz (82.7 kg)  10/03/11 192 lb 9.6 oz (87.4 kg)      Other studies Reviewed: Additional studies/ records that were reviewed today include: . Review of the above records demonstrates:    ASSESSMENT AND PLAN:  1.  Atrial Flutter:   CHADS 2VASC = 2 ( AGE, HTN, )  We had long discussion about his risk of stroke. He's very hesitant to start a blood thinner I explained that his risk was far greater if he does not take an anticoagulant  We'll double check labs today. We'll check a TSH, complete metabolic profile, CBC. We will schedule him for an echocardiogram. We will also schedule him for a  transesophageal echocardiogram/cardioversion next week. He prefers Tuesday  if possible.  We briefly discussed long-term treatment options such as atrial flutter ablation.  I'll see him in one to 2 months for follow-up visit.   Current medicines are reviewed at length with the patient today.  The patient does not have concerns regarding medicines.  Labs/ tests ordered today include:  No orders of the defined types were placed in this encounter.    Disposition:   FU with me in 1-2 months      Mertie Moores, MD  05/18/2016 9:19 AM    [facility] [place], [place], [place]  [address] Phone: [phone]; Fax: [phone]

## 2016-05-23 NOTE — Transfer of Care (Signed)
Immediate Anesthesia Transfer of Care Note  Patient: Chrisandra Carota  Procedure(s) Performed: Procedure(s): CARDIOVERSION (N/A) TRANSESOPHAGEAL ECHOCARDIOGRAM (TEE) (N/A)  Patient Location: Endoscopy Unit  Anesthesia Type:General  Level of Consciousness: awake, alert  and oriented  Airway & Oxygen Therapy: Patient Spontanous Breathing and Patient connected to nasal cannula oxygen  Post-op Assessment: Report given to RN and Post -op Vital signs reviewed and stable  Post vital signs: Reviewed and stable  Last Vitals:  Vitals:   05/23/16 1005 05/23/16 1007  BP:  (!) 153/102  Pulse: (!) 135   Resp: 18   Temp: 36.6 C     Last Pain:  Vitals:   05/23/16 1005  TempSrc: Oral         Complications: No apparent anesthesia complications

## 2016-05-23 NOTE — Progress Notes (Signed)
Echocardiogram Echocardiogram Transesophageal has been performed.  [name] 05/23/2016, 11:44 AM

## 2016-05-23 NOTE — Discharge Instructions (Signed)
TEE ° °YOU HAD AN CARDIAC PROCEDURE TODAY: Refer to the procedure report and other information in the discharge instructions given to you for any specific questions about what was found during the examination. If this information does not answer your questions, please call [facility] office at [phone] to clarify.  ° °DIET: Your first meal following the procedure should be a light meal and then it is ok to progress to your normal diet. A half-sandwich or bowl of soup is an example of a good first meal. Heavy or fried foods are harder to digest and may make you feel nauseous or bloated. Drink plenty of fluids but you should avoid alcoholic beverages for 24 hours. If you had a esophageal dilation, please see attached instructions for diet.  ° °ACTIVITY: Your care partner should take you home directly after the procedure. You should plan to take it easy, moving slowly for the rest of the day. You can resume normal activity the day after the procedure however YOU SHOULD NOT DRIVE, use power tools, machinery or perform tasks that involve climbing or major physical exertion for 24 hours (because of the sedation medicines used during the test).  ° °SYMPTOMS TO REPORT IMMEDIATELY: °A cardiologist can be reached at any hour. Please call [phone] for any of the following symptoms:  °Vomiting of blood or coffee ground material  °New, significant abdominal pain  °New, significant chest pain or pain under the shoulder blades  °Painful or persistently difficult swallowing  °New shortness of breath  °Black, tarry-looking or red, bloody stools ° °FOLLOW UP:  °Please also call with any specific questions about appointments or follow up tests. ° ° °Electrical Cardioversion, Care After °This sheet gives you information about how to care for yourself after your procedure. Your health care provider may also give you more specific instructions. If you have problems or questions, contact your health care provider. °What can I  expect after the procedure? °After the procedure, it is common to have: °· Some redness on the skin where the shocks were given. °Follow these instructions at home: °· Do not drive for 24 hours if you were given a medicine to help you relax (sedative). °· Take over-the-counter and prescription medicines only as told by your health care provider. °· Ask your health care provider how to check your pulse. Check it often. °· Rest for 48 hours after the procedure or as told by your health care provider. °· Avoid or limit your caffeine use as told by your health care provider. °Contact a health care provider if: °· You feel like your heart is beating too quickly or your pulse is not regular. °· You have a serious muscle cramp that does not go away. °Get help right away if: °· You have discomfort in your chest. °· You are dizzy or you feel faint. °· You have trouble breathing or you are short of breath. °· Your speech is slurred. °· You have trouble moving an arm or leg on one side of your body. °· Your fingers or toes turn cold or blue. °This information is not intended to replace advice given to you by your health care provider. Make sure you discuss any questions you have with your health care provider. °Document Released: 01/29/2013 Document Revised: 11/12/2015 Document Reviewed: 10/15/2015 °Elsevier Interactive Patient Education © 2017 [employer] ° °

## 2016-05-23 NOTE — Interval H&P Note (Signed)
History and Physical Interval Note:  05/23/2016 10:22 AM  [name]  has presented today for surgery, with the diagnosis of aflutter  The various methods of treatment have been discussed with the patient and family. After consideration of risks, benefits and other options for treatment, the patient has consented to  Procedure(s): CARDIOVERSION (N/A) TRANSESOPHAGEAL ECHOCARDIOGRAM (TEE) (N/A) as a surgical intervention .  The patient's history has been reviewed, patient examined, no change in status, stable for surgery.  I have reviewed the patient's chart and labs.  Questions were answered to the patient's satisfaction.     Skeet Latch, MD 05/23/2016  10:22 AM

## 2016-05-23 NOTE — Anesthesia Postprocedure Evaluation (Addendum)
Anesthesia Post Note  Patient: [name]  Procedure(s) Performed: Procedure(s) (LRB): CARDIOVERSION (N/A) TRANSESOPHAGEAL ECHOCARDIOGRAM (TEE) (N/A)  Patient location during evaluation: Endoscopy Anesthesia Type: General Level of consciousness: awake and alert Pain management: pain level controlled Vital Signs Assessment: post-procedure vital signs reviewed and stable Respiratory status: spontaneous breathing, nonlabored ventilation, respiratory function stable and patient connected to nasal cannula oxygen Cardiovascular status: blood pressure returned to baseline and stable Postop Assessment: no signs of nausea or vomiting Anesthetic complications: no       Last Vitals:  Vitals:   05/23/16 1148 05/23/16 1158  BP: 106/72 110/79  Pulse: 77 77  Resp: (!) 23 12  Temp:      Last Pain:  Vitals:   05/23/16 1138  TempSrc: Oral                 Thurmond Hildebran

## 2016-05-23 NOTE — CV Procedure (Addendum)
Brief TEE/DCCV Note  LVEF 30-35% Mild-moderate MR Mild TR Trivial PR Moderate LA enlargement No LA/LAA thrombus or mass  Electrical Cardioversion Procedure Note [name] [id number] June 19, 1945  Procedure: Electrical Cardioversion Indications:  Atrial Flutter  Procedure Details Consent: Risks of procedure as well as the alternatives and risks of each were explained to the (patient/caregiver).  Consent for procedure obtained. Time Out: Verified patient identification, verified procedure, site/side was marked, verified correct patient position, special equipment/implants available, medications/allergies/relevent history reviewed, required imaging and test results available.  Performed  Patient placed on cardiac monitor, pulse oximetry, supplemental oxygen as necessary.  Sedation given: 100 mg propofol Pacer pads placed anterior and posterior chest.  Cardioverted 1 time(s).  Cardioverted at 120J.  Evaluation Findings: Post procedure EKG shows: NSR Complications: None Patient did tolerate procedure well.   Skeet Latch, MD, [facility] 05/23/2016, 11:35 AM

## 2016-05-24 ENCOUNTER — Encounter (HOSPITAL_COMMUNITY): Payer: Self-pay | Admitting: Cardiovascular Disease

## 2016-05-26 NOTE — Addendum Note (Signed)
Addended by: Emmaline Life on: 05/26/2016 08:32 AM   Modules accepted: Orders

## 2016-06-01 ENCOUNTER — Encounter: Payer: Self-pay | Admitting: Cardiovascular Disease

## 2016-06-07 ENCOUNTER — Encounter: Payer: Self-pay | Admitting: Cardiovascular Disease

## 2016-06-07 ENCOUNTER — Ambulatory Visit (INDEPENDENT_AMBULATORY_CARE_PROVIDER_SITE_OTHER): Payer: Federal, State, Local not specified - PPO | Admitting: Cardiovascular Disease

## 2016-06-07 ENCOUNTER — Other Ambulatory Visit: Payer: Federal, State, Local not specified - PPO

## 2016-06-07 VITALS — BP 136/78 | HR 68 | Ht 69.5 in | Wt 190.1 lb

## 2016-06-07 DIAGNOSIS — I5022 Chronic systolic (congestive) heart failure: Secondary | ICD-10-CM

## 2016-06-07 DIAGNOSIS — I483 Typical atrial flutter: Secondary | ICD-10-CM

## 2016-06-07 MED ORDER — LOSARTAN POTASSIUM 50 MG PO TABS: 50.0000 mg | ORAL_TABLET | Freq: Every day | ORAL | 11 refills | Status: AC

## 2016-06-07 NOTE — Patient Instructions (Addendum)
Medication Instructions:  STOP Diltiazem (Cardizem) START Losartan 50 mg once daily   Labwork: TODAY - CBC, BMET  Your physician recommends that you return for lab work in: 3 weeks for basic metabolic panel   Testing/Procedures: None Ordered   Follow-Up: Your physician recommends that you schedule a follow-up appointment in: 3 months with Dr. Acie Fredrickson.    If you need a refill on your cardiac medications before your next appointment, please call your pharmacy.   Thank you for choosing [facility]! Christen Bame, RN [phone]

## 2016-06-07 NOTE — Progress Notes (Signed)
Cardiology Office Note   Date:  06/07/2016   ID:  [name], DOB 1946/03/04, MRN [id number]  PCP:  Zane Herald, MD  Cardiologist:   Mertie Moores, MD   Chief Complaint  Patient presents with  . Follow-up    atrial flutter   Problem List 1. Polycythemia 2. Atrial flutter 3. R nephrectomy - 1975, accident while playing softball   History of Present Illness: [name] is a 71 y.o. male who presents for Further evaluation and management of his atrial flutter.   was diagnosed with polycythemia. He donates blood every 21 days. His fast heart rate was found during one of the blood donations  He is basically asymptomatic.   Cannot tell that his HR is fast. HR is not always that fast.   Used to get regular exercise - limited by hip pain Used to run.   Cycling bothers his hip more.   Not limited by CP or dyspnea.   Feb. 14, 2018: [name] is seen today for follow up up his atrial flutter.   Seen with wife, Caren Griffins.   Echocardiogram done 05/22/2016 shows moderate to severe LV dysfunction with an ejection fraction of 30-35%. He has mild mitral regurgitation. He had a successful TEE / Cardioversion  On Jan. 30, 2018 He thinks that he may be feeling better Does not want to take the Eliquis ( says it decreases his appetite   Past Medical History:  Diagnosis Date  . 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 Prostate cancer ([place]) 06-11-2006    Past Surgical History:  Procedure Laterality Date  . CARDIOVERSION N/A 05/23/2016   Procedure: CARDIOVERSION;  Surgeon: Skeet Latch, MD;  Location: [place] [facility];  Service: Cardiovascular;  Laterality: N/A;  . NEPHRECTOMY  1995   right nephrectomy after tramua  . TEE WITHOUT CARDIOVERSION N/A 05/23/2016   Procedure: TRANSESOPHAGEAL ECHOCARDIOGRAM (TEE);  Surgeon: Skeet Latch, MD;  Location: [facility] ENDOSCOPY;  Service: Cardiovascular;  Laterality: N/A;     Current Outpatient Prescriptions  Medication Sig Dispense  Refill  . apixaban (ELIQUIS) 5 MG TABS tablet Take 1 tablet (5 mg total) by mouth 2 (two) times daily. 60 tablet 11  . diltiazem (CARDIZEM CD) 180 MG 24 hr capsule Take 180 mg by mouth daily.    Marland Kitchen ibuprofen (ADVIL,MOTRIN) 200 MG tablet Take 200 mg by mouth as needed.    . metoprolol tartrate (LOPRESSOR) 25 MG tablet Take 1 tablet (25 mg total) by mouth 2 (two) times daily. 60 tablet 11  . Multiple Vitamin (MULTIVITAMIN) capsule Take 1 capsule by mouth daily.    Marland Kitchen NATURE-THROID 32.5 MG tablet Take 1 tablet by mouth 2 (two) times daily.    Marland Kitchen OVER THE COUNTER MEDICATION Take 1 tablet by mouth daily. Rosemary Leaf    . OVER THE COUNTER MEDICATION Aloe Vera juice    . OVER THE COUNTER MEDICATION Take 1 tablet by mouth daily. Saffron 1 daily    . OVER THE COUNTER MEDICATION Take 4 mg by mouth daily. Astaxanthin 4mg  daily    . OVER THE COUNTER MEDICATION Take 1 tablet by mouth daily. Joint formula 1 daily    . OVER THE COUNTER MEDICATION Take 2 tablets by mouth daily. Whole Food MultiVitamin  Plus Vital Minerals    . OVER THE COUNTER MEDICATION High potency dry Vitamin d-3 1,000mg  or 1 gr.    Marland Kitchen OVER THE COUNTER MEDICATION L-Proline 500 mg (joints)    . OVER THE COUNTER MEDICATION Glycine 1000 mg (kidney)    .  OVER THE COUNTER MEDICATION Nicinamide 500 mg; Vitamin B-3 1000 mg (cholesterol)    . OVER THE COUNTER MEDICATION DEHA 25 mg (dissolve in mouth)    . OVER THE COUNTER MEDICATION Pregnenolone 100 mg    . OVER THE COUNTER MEDICATION Vitamin K 150 mcg( Super K w /K2 complex)    . OVER THE COUNTER MEDICATION Complete Probiotics 800 mg (digestion)    . OVER THE COUNTER MEDICATION HiAcives Tart Cherry Extract 465 mg    . OVER THE COUNTER MEDICATION AlgaeCal Plus 2 tabs daily    . OVER THE COUNTER MEDICATION 2 tablets daily.    Marland Kitchen OVER THE COUNTER MEDICATION 1 tablet daily.     No current facility-administered medications for this visit.     Allergies:   Gluten meal; Lactose intolerance (gi); and  Morphine sulfate    Social History:  The patient  reports that he has never smoked. He has never used smokeless tobacco. He reports that he does not drink alcohol or use drugs.   Family History:  The patient's family history includes Heart failure in his father and mother; Rheumatic fever in his brother.    ROS:  Please see the history of present illness.    Review of Systems: Constitutional:  denies fever, chills, diaphoresis, appetite change and fatigue.  HEENT: denies photophobia, eye pain, redness, hearing loss, ear pain, congestion, sore throat, rhinorrhea, sneezing, neck pain, neck stiffness and tinnitus.  Respiratory: denies SOB, DOE, cough, chest tightness, and wheezing.  Cardiovascular: denies chest pain, palpitations and leg swelling.  Gastrointestinal: denies nausea, vomiting, abdominal pain, diarrhea, constipation, blood in stool.  Genitourinary: denies dysuria, urgency, frequency, hematuria, flank pain and difficulty urinating.  Musculoskeletal: denies  myalgias, back pain, joint swelling, arthralgias and gait problem.   Skin: denies pallor, rash and wound.  Neurological: denies dizziness, seizures, syncope, weakness, light-headedness, numbness and headaches.   Hematological: denies adenopathy, easy bruising, personal or family bleeding history.  Psychiatric/ Behavioral: denies suicidal ideation, mood changes, confusion, nervousness, sleep disturbance and agitation.       All other systems are reviewed and negative.    PHYSICAL EXAM: VS:  BP 136/78 (BP Location: Right Arm, Patient Position: Sitting, Cuff Size: Normal)   Pulse 68   Ht 5' 9.5" (1.765 m)   Wt 190 lb 1.9 oz (86.2 kg)   SpO2 97%   BMI 27.67 kg/m  , BMI Body mass index is 27.67 kg/m. GEN: Well nourished, well developed, in no acute distress  HEENT: normal  Neck: no JVD, carotid bruits, or masses Cardiac: RRR; no murmurs, rubs, or gallops,no edema  Respiratory:  clear to auscultation bilaterally,  normal work of breathing GI: soft, nontender, nondistended, + BS MS: no deformity or atrophy  Skin: warm and dry, no rash Neuro:  Strength and sensation are intact Psych: normal   EKG:  EKG is ordered today. The ekg ordered today demonstrates Atrial flutter with V rate of 145.     Recent Labs: 05/18/2016: ALT 27; BUN 21; Creatinine, Ser 1.18; Platelets 106; Potassium 4.4; Sodium 142; TSH 4.220    Lipid Panel    Component Value Date/Time   CHOL 148 05/18/2016 1007   TRIG 116 05/18/2016 1007   HDL 47 05/18/2016 1007   CHOLHDL 3.1 05/18/2016 1007   LDLCALC 78 05/18/2016 1007      Wt Readings from Last 3 Encounters:  06/07/16 190 lb 1.9 oz (86.2 kg)  05/23/16 190 lb (86.2 kg)  05/18/16 190 lb 12.8 oz (86.5  kg)      Other studies Reviewed: Additional studies/ records that were reviewed today include: . Review of the above records demonstrates:    ASSESSMENT AND PLAN:  1.  Atrial Flutter:   CHADS 2VASC = 2 (3)  ( AGE, CHF ? HTN)  Is back in NSR currently  Cont. Metoprolol, we May need to increase the dose to maintain rate control but we will continue to evaluate this. DC Diltiazem since he has a history of chronic systolic congestive heart failure  2.  Chronic systolic congestive heart failure:   His ejection fraction is 30-35%. This is most likely rate related issue. We will continue the metoprolol since his heart rate is well-controlled. We will add losartan 50 mg a day. I'll see him again in 3 months. We will plan on repeating his echocardiogram sometime later in the summer.     Current medicines are reviewed at length with the patient today.  The patient does not have concerns regarding medicines.  Labs/ tests ordered today include:  No orders of the defined types were placed in this encounter.    Disposition:   FU with me in  3 months      Mertie Moores, MD  06/07/2016 9:40 AM    [facility] [place], [place], [place]   [address] Phone: [phone]; Fax: [phone]

## 2016-06-08 LAB — BASIC METABOLIC PANEL
BUN / CREAT RATIO: 16 (ref 10–24)
BUN: 19 mg/dL (ref 8–27)
CHLORIDE: 98 mmol/L (ref 96–106)
CO2: 24 mmol/L (ref 18–29)
Calcium: 9.6 mg/dL (ref 8.6–10.2)
Creatinine, Ser: 1.19 mg/dL (ref 0.76–1.27)
GFR, EST AFRICAN AMERICAN: 70 mL/min/{1.73_m2} (ref >59)
GFR, EST NON AFRICAN AMERICAN: 61 mL/min/{1.73_m2} (ref >59)
Glucose: 83 mg/dL (ref 65–99)
POTASSIUM: 5 mmol/L (ref 3.5–5.2)
Sodium: 140 mmol/L (ref 134–144)

## 2016-06-08 LAB — CBC WITH DIFFERENTIAL/PLATELET
BASOS ABS: 0.1 10*3/uL (ref 0.0–0.2)
Basos: 1 %
EOS (ABSOLUTE): 0.3 10*3/uL (ref 0.0–0.4)
EOS: 5 %
HEMATOCRIT: 53.5 % — AB (ref 37.5–51.0)
Hemoglobin: 18.9 g/dL — ABNORMAL HIGH (ref 13.0–17.7)
IMMATURE GRANULOCYTES: 1 %
Immature Grans (Abs): 0 10*3/uL (ref 0.0–0.1)
Lymphocytes Absolute: 1.2 10*3/uL (ref 0.7–3.1)
Lymphs: 20 %
MCH: 31.9 pg (ref 26.6–33.0)
MCHC: 35.3 g/dL (ref 31.5–35.7)
MCV: 90 fL (ref 79–97)
Monocytes Absolute: 0.7 10*3/uL (ref 0.1–0.9)
Monocytes: 12 %
NEUTROS PCT: 61 %
Neutrophils Absolute: 3.7 10*3/uL (ref 1.4–7.0)
PLATELETS: 95 10*3/uL — AB (ref 150–379)
RBC: 5.93 x10E6/uL — AB (ref 4.14–5.80)
RDW: 16.4 % — AB (ref 12.3–15.4)
WBC: 6 10*3/uL (ref 3.4–10.8)

## 2016-06-20 ENCOUNTER — Other Ambulatory Visit: Payer: Federal, State, Local not specified - PPO

## 2016-06-23 ENCOUNTER — Ambulatory Visit: Payer: Federal, State, Local not specified - PPO | Admitting: Cardiovascular Disease

## 2016-06-27 ENCOUNTER — Other Ambulatory Visit (INDEPENDENT_AMBULATORY_CARE_PROVIDER_SITE_OTHER): Payer: Federal, State, Local not specified - PPO

## 2016-06-27 DIAGNOSIS — I483 Typical atrial flutter: Secondary | ICD-10-CM

## 2016-06-27 LAB — CBC
HEMATOCRIT: 50.7 % (ref 37.5–51.0)
HEMOGLOBIN: 18.2 g/dL — AB (ref 13.0–17.7)
MCH: 32.7 pg (ref 26.6–33.0)
MCHC: 35.9 g/dL — AB (ref 31.5–35.7)
MCV: 91 fL (ref 79–97)
Platelets: 102 10*3/uL — ABNORMAL LOW (ref 150–379)
RBC: 5.56 x10E6/uL (ref 4.14–5.80)
RDW: 15.2 % (ref 12.3–15.4)
WBC: 6 10*3/uL (ref 3.4–10.8)

## 2016-06-27 LAB — BASIC METABOLIC PANEL
BUN / CREAT RATIO: 16 (ref 10–24)
BUN: 19 mg/dL (ref 8–27)
CALCIUM: 9 mg/dL (ref 8.6–10.2)
CHLORIDE: 102 mmol/L (ref 96–106)
CO2: 20 mmol/L (ref 18–29)
Creatinine, Ser: 1.18 mg/dL (ref 0.76–1.27)
GFR, EST AFRICAN AMERICAN: 70 mL/min/{1.73_m2} (ref >59)
GFR, EST NON AFRICAN AMERICAN: 61 mL/min/{1.73_m2} (ref >59)
Glucose: 100 mg/dL — ABNORMAL HIGH (ref 65–99)
Potassium: 4.1 mmol/L (ref 3.5–5.2)
Sodium: 141 mmol/L (ref 134–144)

## 2016-09-11 ENCOUNTER — Ambulatory Visit: Payer: Federal, State, Local not specified - PPO | Admitting: Cardiovascular Disease

## 2016-09-12 ENCOUNTER — Encounter: Payer: Self-pay | Admitting: Cardiovascular Disease

## 2016-09-12 ENCOUNTER — Ambulatory Visit (INDEPENDENT_AMBULATORY_CARE_PROVIDER_SITE_OTHER): Payer: Federal, State, Local not specified - PPO | Admitting: Cardiovascular Disease

## 2016-09-12 VITALS — BP 126/70 | HR 63 | Ht 69.0 in | Wt 183.0 lb

## 2016-09-12 DIAGNOSIS — I480 Paroxysmal atrial fibrillation: Secondary | ICD-10-CM

## 2016-09-12 DIAGNOSIS — I5022 Chronic systolic (congestive) heart failure: Secondary | ICD-10-CM | POA: Diagnosis not present

## 2016-09-12 NOTE — Progress Notes (Signed)
Cardiology Office Note   Date:  09/12/2016   ID:  [name], DOB 08/01/1945, MRN [id number]  PCP:  Zane Herald, MD  Cardiologist:   Mertie Moores, MD   Chief Complaint  Patient presents with  . Atrial Fibrillation   Problem List 1. Polycythemia 2. Atrial flutter 3. R nephrectomy - 1975, accident while playing softball 4. Chronic systolic CHF - EF 99-24% ( possibly rate related due to his atrial fib )   Original Notes.: [name] is a 71 y.o. male who presents for Further evaluation and management of his atrial flutter.   was diagnosed with polycythemia. He donates blood every 21 days. His fast heart rate was found during one of the blood donations  He is basically asymptomatic.   Cannot tell that his HR is fast. HR is not always that fast.   Used to get regular exercise - limited by hip pain Used to run.   Cycling bothers his hip more.   Not limited by CP or dyspnea.   Feb. 14, 2018: [name] is seen today for follow up up his atrial flutter.   Seen with wife, [name].   Echocardiogram done 05/22/2016 shows moderate to severe LV dysfunction with an ejection fraction of 30-35%. He has mild mitral regurgitation. He had a successful TEE / Cardioversion  On Jan. 30, 2018 He thinks that he may be feeling better Does not want to take the Eliquis ( says it decreases his appetite  09/12/2016: [name] is seen back today for follow-up of his paroxysmal atrial fibrillation and chronic congestive heart failure. Is tolerating his meds well Wants to be off the Eliquis .  CHADS2VASC score is 3 ( AGE, CHF, HTN)  We had a long discussion about his risk of CVA and he does not want to take an anticoagulant     Past Medical History:  Diagnosis Date  . 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 Prostate cancer ([place]) 06-11-2006    Past Surgical History:  Procedure Laterality Date  . CARDIOVERSION N/A 05/23/2016   Procedure: CARDIOVERSION;  Surgeon: Skeet Latch,  MD;  Location: [place];  Service: Cardiovascular;  Laterality: N/A;  . NEPHRECTOMY  1995   right nephrectomy after tramua  . TEE WITHOUT CARDIOVERSION N/A 05/23/2016   Procedure: TRANSESOPHAGEAL ECHOCARDIOGRAM (TEE);  Surgeon: Skeet Latch, MD;  Location: [facility] ENDOSCOPY;  Service: Cardiovascular;  Laterality: N/A;     Current Outpatient Prescriptions  Medication Sig Dispense Refill  . apixaban (ELIQUIS) 5 MG TABS tablet Take 1 tablet (5 mg total) by mouth 2 (two) times daily. 60 tablet 11  . ibuprofen (ADVIL,MOTRIN) 200 MG tablet Take 200 mg by mouth as needed.    Marland Kitchen losartan (COZAAR) 50 MG tablet Take 1 tablet (50 mg total) by mouth daily. 30 tablet 11  . metoprolol tartrate (LOPRESSOR) 25 MG tablet Take 1 tablet (25 mg total) by mouth 2 (two) times daily. 60 tablet 11  . Multiple Vitamin (MULTIVITAMIN) capsule Take 1 capsule by mouth daily.    Marland Kitchen NATURE-THROID 32.5 MG tablet Take 1 tablet by mouth 2 (two) times daily.    Marland Kitchen OVER THE COUNTER MEDICATION Take 1 tablet by mouth daily. Rosemary Leaf    . OVER THE COUNTER MEDICATION Aloe Vera juice    . OVER THE COUNTER MEDICATION Take 1 tablet by mouth daily. Saffron 1 daily    . OVER THE COUNTER MEDICATION Take 4 mg by mouth daily. Astaxanthin 4mg  daily    . OVER THE COUNTER  MEDICATION Take 1 tablet by mouth daily. Joint formula 1 daily    . OVER THE COUNTER MEDICATION Take 2 tablets by mouth daily. Whole Food MultiVitamin  Plus Vital Minerals    . OVER THE COUNTER MEDICATION High potency dry Vitamin d-3 1,000mg  or 1 gr.    Marland Kitchen OVER THE COUNTER MEDICATION L-Proline 500 mg (joints)    . OVER THE COUNTER MEDICATION Glycine 1000 mg (kidney)    . OVER THE COUNTER MEDICATION Nicinamide 500 mg; Vitamin B-3 1000 mg (cholesterol)    . OVER THE COUNTER MEDICATION DEHA 25 mg (dissolve in mouth)    . OVER THE COUNTER MEDICATION Pregnenolone 100 mg    . OVER THE COUNTER MEDICATION Vitamin K 150 mcg( Super K w /K2 complex)    . OVER THE COUNTER  MEDICATION Complete Probiotics 800 mg (digestion)    . OVER THE COUNTER MEDICATION HiAcives Tart Cherry Extract 465 mg    . OVER THE COUNTER MEDICATION AlgaeCal Plus 2 tabs daily    . OVER THE COUNTER MEDICATION 2 tablets daily.    Marland Kitchen OVER THE COUNTER MEDICATION 1 tablet daily.     No current facility-administered medications for this visit.     Allergies:   Gluten meal; Lactose intolerance (gi); and Morphine sulfate    Social History:  The patient  reports that he has never smoked. He has never used smokeless tobacco. He reports that he does not drink alcohol or use drugs.   Family History:  The patient's family history includes Heart failure in his father and mother; Rheumatic fever in his brother.    ROS:  Please see the history of present illness.    Review of Systems: Constitutional:  denies fever, chills, diaphoresis, appetite change and fatigue.  HEENT: denies photophobia, eye pain, redness, hearing loss, ear pain, congestion, sore throat, rhinorrhea, sneezing, neck pain, neck stiffness and tinnitus.  Respiratory: denies SOB, DOE, cough, chest tightness, and wheezing.  Cardiovascular: denies chest pain, palpitations and leg swelling.  Gastrointestinal: denies nausea, vomiting, abdominal pain, diarrhea, constipation, blood in stool.  Genitourinary: denies dysuria, urgency, frequency, hematuria, flank pain and difficulty urinating.  Musculoskeletal: denies  myalgias, back pain, joint swelling, arthralgias and gait problem.   Skin: denies pallor, rash and wound.  Neurological: denies dizziness, seizures, syncope, weakness, light-headedness, numbness and headaches.   Hematological: denies adenopathy, easy bruising, personal or family bleeding history.  Psychiatric/ Behavioral: denies suicidal ideation, mood changes, confusion, nervousness, sleep disturbance and agitation.       All other systems are reviewed and negative.    PHYSICAL EXAM: VS:  BP 126/70   Pulse 63   Ht 5'  9" (1.753 m)   Wt 183 lb (83 kg)   SpO2 97%   BMI 27.02 kg/m  , BMI Body mass index is 27.02 kg/m. GEN: Well nourished, well developed, in no acute distress  HEENT: normal  Neck: no JVD, carotid bruits, or masses Cardiac: RRR; no murmurs, rubs, or gallops,no edema  Respiratory:  clear to auscultation bilaterally, normal work of breathing GI: soft, nontender, nondistended, + BS MS: no deformity or atrophy  Skin: warm and dry, no rash Neuro:  Strength and sensation are intact Psych: normal   EKG:  EKG is ordered today. The ekg ordered today demonstrates Atrial flutter with V rate of 145.     Recent Labs: 05/18/2016: ALT 27; TSH 4.220 06/27/2016: BUN 19; Creatinine, Ser 1.18; Platelets 102; Potassium 4.1; Sodium 141    Lipid Panel  Component Value Date/Time   CHOL 148 05/18/2016 1007   TRIG 116 05/18/2016 1007   HDL 47 05/18/2016 1007   CHOLHDL 3.1 05/18/2016 1007   LDLCALC 78 05/18/2016 1007      Wt Readings from Last 3 Encounters:  09/12/16 183 lb (83 kg)  06/07/16 190 lb 1.9 oz (86.2 kg)  05/23/16 190 lb (86.2 kg)      Other studies Reviewed: Additional studies/ records that were reviewed today include: . Review of the above records demonstrates:    ASSESSMENT AND PLAN:  1.  Atrial Flutter:   CHADS 2VASC = 3 ( AGE, CHF,  HTN)  Is back in NSR currently   We had a long discussion about his risk of CVA vs. Risk of bleeding .  He wants to wait and see what the echocardiogram shows and then make a  determination whether or not he is going  to stop Eliquis.  I made it clear that I thought he should take the [place]in and that all of the studies supported that he was at higher risk of having a CVA compared to his risk of bleeding.  If he does decide to stop the DOAC, I will suggest ASA 81 mg a day   2.  Chronic systolic congestive heart failure:   His ejection fraction is 30-35%. This is most likely rate related issue.   On metoprolol and Losartan   Repeat echo .     Current medicines are reviewed at length with the patient today.  The patient does not have concerns regarding medicines.  Labs/ tests ordered today include:   Orders Placed This Encounter  Procedures  . ECHOCARDIOGRAM COMPLETE     Disposition:   FU with me in  6 months      Mertie Moores, MD  09/12/2016 10:15 AM    [facility] [place], [place], [place]  [address] Phone: [phone]; Fax: [phone]

## 2016-09-12 NOTE — Patient Instructions (Signed)
Medication Instructions:  Your physician recommends that you continue on your current medications as directed. Please refer to the Current Medication list given to you today.   Labwork: None  Testing/Procedures: Your physician has requested that you have an echocardiogram. Echocardiography is a painless test that uses sound waves to create images of your heart. It provides your doctor with information about the size and shape of your heart and how well your heart's chambers and valves are working. This procedure takes approximately one hour. There are no restrictions for this procedure.  Follow-Up: Your physician wants you to follow-up in: 6 months with Dr. Acie Fredrickson. You will receive a reminder letter in the mail two months in advance. If you don't receive a letter, please call our office to schedule the follow-up appointment.   Any Other Special Instructions Will Be Listed Below (If Applicable).     If you need a refill on your cardiac medications before your next appointment, please call your pharmacy.

## 2016-09-25 NOTE — Addendum Note (Signed)
Addendum  created 09/25/16 1032 by Oleta Mouse, MD   Sign clinical note

## 2016-09-28 ENCOUNTER — Other Ambulatory Visit: Payer: Self-pay

## 2016-09-28 ENCOUNTER — Ambulatory Visit (HOSPITAL_COMMUNITY): Payer: Federal, State, Local not specified - PPO | Attending: Cardiology

## 2016-09-28 DIAGNOSIS — I480 Paroxysmal atrial fibrillation: Secondary | ICD-10-CM | POA: Insufficient documentation

## 2016-09-28 DIAGNOSIS — I517 Cardiomegaly: Secondary | ICD-10-CM | POA: Insufficient documentation

## 2016-11-29 ENCOUNTER — Telehealth: Payer: Self-pay | Admitting: Cardiovascular Disease

## 2016-11-29 NOTE — Telephone Encounter (Signed)
Spoke with patient who states he thinks he got dehydrated yesterday after working in the yard and his pulse rate was very high through the day and night. He states his pulse was 100 bpm yesterday and he did not sleep well. He reports BP and pulse readings today: 95/64 mmHg and pulse 74 bpm. He says he thinks the readings are returning to normal and he will continue to monitor. He reports not drinking very much water yesterday but he states he has already had 64 oz today and he is continuing to drink. I advised him to continue to monitor HR and BP and to call back with questions or concerns. He thanked me for the call.

## 2016-11-29 NOTE — Telephone Encounter (Signed)
New message    Pt is calling. He said he needs to talk to nurse about a change. Please call.

## 2016-12-21 ENCOUNTER — Encounter: Payer: Self-pay | Admitting: Gastroenterology

## 2017-02-13 ENCOUNTER — Ambulatory Visit: Payer: Federal, State, Local not specified - PPO | Admitting: Gastroenterology

## 2017-05-01 ENCOUNTER — Ambulatory Visit: Payer: Federal, State, Local not specified - PPO | Admitting: Cardiovascular Disease

## 2017-05-01 ENCOUNTER — Encounter: Payer: Self-pay | Admitting: Cardiovascular Disease

## 2017-05-01 VITALS — BP 100/66 | HR 73 | Ht 69.0 in | Wt 187.8 lb

## 2017-05-01 DIAGNOSIS — I480 Paroxysmal atrial fibrillation: Secondary | ICD-10-CM

## 2017-05-01 NOTE — Patient Instructions (Signed)
Medication Instructions:  Your physician recommends that you start Eliquis or a similar blood thinner. Please call our office if you decide to start this therapy.   Labwork: None Ordered   Testing/Procedures: None Ordered   Follow-Up: Your physician wants you to follow-up in: 6 months with Dr. Acie Fredrickson.  You will receive a reminder letter in the mail two months in advance. If you don't receive a letter, please call our office to schedule the follow-up appointment.   If you need a refill on your cardiac medications before your next appointment, please call your pharmacy.   Thank you for choosing [facility]! Christen Bame, RN [phone]

## 2017-05-01 NOTE — Progress Notes (Signed)
Cardiology Office Note   Date:  05/01/2017   ID:  [name], DOB 1945-12-15, MRN [id number]  PCP:  Zane Herald, MD  Cardiologist:   Mertie Moores, MD   Chief Complaint  Patient presents with  . Atrial Fibrillation   Problem List 1. Polycythemia 2. Atrial flutter 3. R nephrectomy - 1975, accident while playing softball 4. Hx of Chronic systolic CHF - EF 62-13% ( possibly rate related due to his atrial fib ) echocardiogram in June, 2018 shows normal left ventricular systolic function.  Original Notes.: [name] is a 72 y.o. male who presents for Further evaluation and management of his atrial flutter.   was diagnosed with polycythemia. He donates blood every 21 days. His fast heart rate was found during one of the blood donations  He is basically asymptomatic.   Cannot tell that his HR is fast. HR is not always that fast.   Used to get regular exercise - limited by hip pain Used to run.   Cycling bothers his hip more.   Not limited by CP or dyspnea.   Feb. 14, 2018: [name] is seen today for follow up up his atrial flutter.   Seen with wife, Caren Griffins.   Echocardiogram done 05/22/2016 shows moderate to severe LV dysfunction with an ejection fraction of 30-35%. He has mild mitral regurgitation. He had a successful TEE / Cardioversion  On Jan. 30, 2018 He thinks that he may be feeling better Does not want to take the Eliquis ( says it decreases his appetite  09/12/2016: [name] is seen back today for follow-up of his paroxysmal atrial fibrillation and chronic congestive heart failure. Is tolerating his meds well Wants to be off the Eliquis .  CHADS2VASC score is 3 ( AGE, CHF, HTN)  We had a long discussion about his risk of CVA and he does not want to take an anticoagulant   May 01, 2016:  [name] is doing well.  Has had some DOE ( especially when carrying things out to the car.  No angina  Is eager to start back exercising .  Has palpitations  he thinks the  episodes are not as severe as his atrial fib  Is not on Eliquis - is taking Natokinase.  Marland Kitchen       Past Medical History:  Diagnosis Date  . 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 Prostate cancer ([place]) 06-11-2006    Past Surgical History:  Procedure Laterality Date  . CARDIOVERSION N/A 05/23/2016   Procedure: CARDIOVERSION;  Surgeon: Skeet Latch, MD;  Location: [facility];  Service: Cardiovascular;  Laterality: N/A;  . NEPHRECTOMY  1995   right nephrectomy after tramua  . TEE WITHOUT CARDIOVERSION N/A 05/23/2016   Procedure: TRANSESOPHAGEAL ECHOCARDIOGRAM (TEE);  Surgeon: Skeet Latch, MD;  Location: [facility] ENDOSCOPY;  Service: Cardiovascular;  Laterality: N/A;     Current Outpatient Medications  Medication Sig Dispense Refill  . aspirin EC 81 MG tablet Take 81 mg by mouth 3 (three) times a week.    Marland Kitchen EC-RX TESTOSTERONE 10 % CREA Place 1 application onto the skin daily.    Marland Kitchen GLYCINE PO Take 1,000 mg by mouth daily.    Marland Kitchen ibuprofen (ADVIL,MOTRIN) 200 MG tablet Take 200 mg by mouth as needed.    Marland Kitchen losartan (COZAAR) 50 MG tablet Take 1 tablet (50 mg total) by mouth daily. 30 tablet 11  . Lysine 1000 MG TABS Take 1 tablet by mouth 2 (two) times daily.    . metoprolol tartrate (  LOPRESSOR) 25 MG tablet Take 1 tablet (25 mg total) by mouth 2 (two) times daily. 60 tablet 11  . Nattokinase 100 MG CAPS Take 100 mg by mouth 2 (two) times daily.    Marland Kitchen NATURE-THROID 32.5 MG tablet Take 1 tablet by mouth 2 (two) times daily.    Marland Kitchen OVER THE COUNTER MEDICATION Take 4 mg by mouth daily. Astaxanthin 4mg  daily    . OVER THE COUNTER MEDICATION Take 1 tablet by mouth daily. Joint formula 1 daily    . OVER THE COUNTER MEDICATION High potency dry Vitamin d-3 1,000mg  or 1 gr.    Marland Kitchen OVER THE COUNTER MEDICATION L-Proline 500 mg (joints)    . OVER THE COUNTER MEDICATION Glycine 1000 mg (kidney)    . OVER THE COUNTER MEDICATION Nicinamide 500 mg; Vitamin B-3 1000 mg (cholesterol)    . OVER THE  COUNTER MEDICATION DEHA 25 mg (dissolve in mouth)    . OVER THE COUNTER MEDICATION Pregnenolone 100 mg    . OVER THE COUNTER MEDICATION Complete Probiotics 800 mg (digestion)    . OVER THE COUNTER MEDICATION AlgaeCal Plus 2 tabs daily    . OVER THE COUNTER MEDICATION 2 tablets daily.    Marland Kitchen OVER THE COUNTER MEDICATION Take 60 capsules by mouth daily.    Marland Kitchen OVER THE COUNTER MEDICATION Take 2 tablets by mouth daily.    Marland Kitchen OVER THE COUNTER MEDICATION Take 1 Scoop by mouth 2 (two) times daily.    . vitamin C (ASCORBIC ACID) 500 MG tablet Take 500 mg by mouth daily.    . Multiple Vitamin (MULTIVITAMIN) capsule Take 1 capsule by mouth daily.    Marland Kitchen OVER THE COUNTER MEDICATION Aloe Vera juice     No current facility-administered medications for this visit.     Allergies:   Gluten meal; Lactose intolerance (gi); and Morphine sulfate    Social History:  The patient  reports that  has never smoked. he has never used smokeless tobacco. He reports that he does not drink alcohol or use drugs.   Family History:  The patient's family history includes Heart failure in his father and mother; Rheumatic fever in his brother.    ROS: As per current history.  Other systems are negative.  Physical Exam: Blood pressure 100/66, pulse 73, height 5\' 9"  (1.753 m), weight 187 lb 12.8 oz (85.2 kg), SpO2 97 %.  GEN:  Well nourished, well developed in no acute distress HEENT: Normal NECK: No JVD; No carotid bruits LYMPHATICS: No lymphadenopathy CARDIAC:  Irreg. Irreg.   HR is controlled  RESPIRATORY:  Clear to auscultation without rales, wheezing or rhonchi  ABDOMEN: Soft, non-tender, non-distended MUSCULOSKELETAL:  No edema; No deformity  SKIN: Warm and dry NEUROLOGIC:  Alert and oriented x 3   EKG:   May 01, 2017: Atrial fibrillation with a ventricular rate of 83.  No ST or T wave changes.  Recent Labs: 05/18/2016: ALT 27; TSH 4.220 06/27/2016: BUN 19; Creatinine, Ser 1.18; Hemoglobin 18.2; Platelets 102;  Potassium 4.1; Sodium 141    Lipid Panel    Component Value Date/Time   CHOL 148 05/18/2016 1007   TRIG 116 05/18/2016 1007   HDL 47 05/18/2016 1007   CHOLHDL 3.1 05/18/2016 1007   LDLCALC 78 05/18/2016 1007      Wt Readings from Last 3 Encounters:  05/01/17 187 lb 12.8 oz (85.2 kg)  09/12/16 183 lb (83 kg)  06/07/16 190 lb 1.9 oz (86.2 kg)      Other  studies Reviewed: Additional studies/ records that were reviewed today include: . Review of the above records demonstrates:    ASSESSMENT AND PLAN:  1.  Atrial Flutter:   CHADS 2VASC = 3 ( AGE, CHF,  HTN)  He is back in Atrial fib We discussed the importance of anticoagulation  He wants to stay on the Natokinase and not a standard [place]  I've stress to him that I would recommend an approaved [place] medication .    2.   Hx of Chronic systolic congestive heart failure: She she has now resolved.  It was likely a rate related reduction in LV function.  His echocardiogram in June, 2018 reveals normal left ventricular systolic function.   Current medicines are reviewed at length with the patient today.  The patient does not have concerns regarding medicines.  Labs/ tests ordered today include:   No orders of the defined types were placed in this encounter.    Disposition:   FU with me in  6 months      Mertie Moores, MD  05/01/2017 11:09 AM    [address], [place], [place]  [address] Phone: [phone]; Fax: [phone]

## 2017-05-03 ENCOUNTER — Telehealth: Payer: Self-pay | Admitting: Cardiovascular Disease

## 2017-05-03 NOTE — Telephone Encounter (Signed)
New message    Patient calling to discuss procedure options. Please call

## 2017-05-03 NOTE — Telephone Encounter (Signed)
Spoke with patient who called to ask if he can have a procedure to help with his a fib. I advised that in order for patient to have a cardioversion he would need to be willing to take an oral anti-coagulant for at least 30 days afterwards and preferably for 30 days prior to the procedure. I explained that the natural blood thinning agent that he is currently taking is not approved for this type of procedure. When the patient had the previous TEE and cardioversion, he took Eliquis for several months. I offered the option of seeing our NP who specializes in a fib and he requested an appointment. Patient is scheduled to see Roderic Palau, NP on 1/15. He thanked me for my help.

## 2017-05-04 NOTE — Telephone Encounter (Signed)
Agree with note by Michelle Swinyer, RN  

## 2017-05-08 ENCOUNTER — Ambulatory Visit (HOSPITAL_COMMUNITY)
Admission: RE | Admit: 2017-05-08 | Discharge: 2017-05-08 | Disposition: A | Discharge status: Home / Self Care | Payer: Federal, State, Local not specified - PPO | Source: Ambulatory Visit | Attending: Nurse Practitioner | Admitting: Nurse Practitioner

## 2017-05-08 VITALS — BP 108/64 | HR 96 | Ht 69.0 in | Wt 187.0 lb

## 2017-05-08 DIAGNOSIS — Z885 Allergy status to narcotic agent status: Secondary | ICD-10-CM | POA: Insufficient documentation

## 2017-05-08 DIAGNOSIS — E739 Lactose intolerance, unspecified: Secondary | ICD-10-CM | POA: Insufficient documentation

## 2017-05-08 DIAGNOSIS — D693 Immune thrombocytopenic purpura: Secondary | ICD-10-CM | POA: Diagnosis not present

## 2017-05-08 DIAGNOSIS — Z79899 Other long term (current) drug therapy: Secondary | ICD-10-CM | POA: Diagnosis not present

## 2017-05-08 DIAGNOSIS — I480 Paroxysmal atrial fibrillation: Secondary | ICD-10-CM | POA: Diagnosis present

## 2017-05-08 DIAGNOSIS — I481 Persistent atrial fibrillation: Secondary | ICD-10-CM | POA: Insufficient documentation

## 2017-05-08 DIAGNOSIS — Z905 Acquired absence of kidney: Secondary | ICD-10-CM | POA: Diagnosis not present

## 2017-05-08 DIAGNOSIS — Z8546 Personal history of malignant neoplasm of prostate: Secondary | ICD-10-CM | POA: Insufficient documentation

## 2017-05-08 DIAGNOSIS — I4819 Other persistent atrial fibrillation: Secondary | ICD-10-CM

## 2017-05-08 DIAGNOSIS — Z8249 Family history of ischemic heart disease and other diseases of the circulatory system: Secondary | ICD-10-CM | POA: Insufficient documentation

## 2017-05-08 MED ORDER — APIXABAN 5 MG PO TABS: 5.0000 mg | ORAL_TABLET | Freq: Two times a day (BID) | ORAL | 0 refills | Status: DC

## 2017-05-08 NOTE — Patient Instructions (Signed)
Stop aspirin  Start Eliquis 5mg  twice a day - after we confirm your kidney function.

## 2017-05-08 NOTE — Progress Notes (Signed)
Primary Care Physician: Jordan Herald, MD Referring Physician: Dr. Pennie Banter Stanley is a 72 y.o. male with a h/o paroxysmal afib that is in the afib clinic for evaluation. He gives a history of afib that was newly diagnosed in the fall of 2017 and had successful cardioversion 04/2016. He has enjoyed SR until recently when he noted return to afib. Echo when first diagnosed showed reduced EF that normalized with return of SR. He was initially on eliquis but after cardioversion stopped this and went on a " natural" blood thinner, Nattokinase and asa 81 mg tid. He also takes 15 other supplements daily.  He is willing to go back on eliquis 5 mg bid for the 3 weeks before and 4 weeks afterward to have a cardioversion but wants to return to Nattokinase after that. He states that he does not feel as well on eliquis and he likes to give blood on a regular basis to manage his polycythemia and he can not do that with eliquis on board. Recent creatine by his records 1.28 03/2017.  He does not smoke, drink alcohol, use excessive caffeine. Wife states that he snores but pt is not interested in a sleep study.  Today, he denies symptoms of palpitations, chest pain, shortness of breath, orthopnea, PND, lower extremity edema, dizziness, presyncope, syncope, or neurologic sequela. The patient is tolerating medications without difficulties and is otherwise without complaint today.   Past Medical History:  Diagnosis Date  . 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 Prostate cancer ([place]) 06-11-2006   Past Surgical History:  Procedure Laterality Date  . CARDIOVERSION N/A 05/23/2016   Procedure: CARDIOVERSION;  Surgeon: Jordan Latch, MD;  Location: [facility];  Service: Cardiovascular;  Laterality: N/A;  . NEPHRECTOMY  1995   right nephrectomy after tramua  . TEE WITHOUT CARDIOVERSION N/A 05/23/2016   Procedure: TRANSESOPHAGEAL ECHOCARDIOGRAM (TEE);  Surgeon: Jordan Latch, MD;   Location: [facility] ENDOSCOPY;  Service: Cardiovascular;  Laterality: N/A;    Current Outpatient Medications  Medication Sig Dispense Refill  . EC-RX TESTOSTERONE 10 % CREA Place 1 application onto the skin daily.    Marland Kitchen GLYCINE PO Take 1,000 mg by mouth daily.    Marland Kitchen ibuprofen (ADVIL,MOTRIN) 200 MG tablet Take 200 mg by mouth as needed.    Marland Kitchen losartan (COZAAR) 50 MG tablet Take 1 tablet (50 mg total) by mouth daily. 30 tablet 11  . Lysine 1000 MG TABS Take 1 tablet by mouth 2 (two) times daily.    . metoprolol tartrate (LOPRESSOR) 25 MG tablet Take 1 tablet (25 mg total) by mouth 2 (two) times daily. 60 tablet 11  . Multiple Vitamin (MULTIVITAMIN) capsule Take 1 capsule by mouth daily.    . Nattokinase 100 MG CAPS Take 100 mg by mouth 2 (two) times daily.    Marland Kitchen NATURE-THROID 32.5 MG tablet Take 1 tablet by mouth 2 (two) times daily.    Marland Kitchen OVER THE COUNTER MEDICATION Aloe Vera juice    . OVER THE COUNTER MEDICATION Take 4 mg by mouth daily. Astaxanthin 4mg  daily    . OVER THE COUNTER MEDICATION Take 1 tablet by mouth daily. Joint formula 1 daily    . OVER THE COUNTER MEDICATION High potency dry Vitamin d-3 1,000mg  or 1 gr.    Marland Kitchen OVER THE COUNTER MEDICATION L-Proline 500 mg (joints)    . OVER THE COUNTER MEDICATION Glycine 1000 mg (kidney)    . OVER THE COUNTER MEDICATION Nicinamide 500 mg; Vitamin B-3 1000  mg (cholesterol)    . OVER THE COUNTER MEDICATION DEHA 25 mg (dissolve in mouth)    . OVER THE COUNTER MEDICATION Pregnenolone 100 mg    . OVER THE COUNTER MEDICATION Complete Probiotics 800 mg (digestion)    . OVER THE COUNTER MEDICATION AlgaeCal Plus 2 tabs daily    . OVER THE COUNTER MEDICATION 2 tablets daily.    Marland Kitchen OVER THE COUNTER MEDICATION Take 60 capsules by mouth daily.    Marland Kitchen OVER THE COUNTER MEDICATION Take 2 tablets by mouth daily.    Marland Kitchen OVER THE COUNTER MEDICATION Take 1 Scoop by mouth 2 (two) times daily.    . vitamin C (ASCORBIC ACID) 500 MG tablet Take 500 mg by mouth daily.    Marland Kitchen  apixaban (ELIQUIS) 5 MG TABS tablet Take 1 tablet (5 mg total) by mouth 2 (two) times daily. 60 tablet 0   No current facility-administered medications for this encounter.     Allergies  Allergen Reactions  . Gluten Meal   . Lactose Intolerance (Gi)   . Morphine Sulfate Other (See Comments)    Hallucinations    Social History   Socioeconomic History  . Marital status: Married    Spouse name: Not on file  . Number of children: Not on file  . Years of education: Not on file  . Highest education level: Not on file  Social Needs  . Financial resource strain: Not on file  . Food insecurity - worry: Not on file  . Food insecurity - inability: Not on file  . Transportation needs - medical: Not on file  . Transportation needs - non-medical: Not on file  Occupational History  . Not on file  Tobacco Use  . Smoking status: Never Smoker  . Smokeless tobacco: Never Used  Substance and Sexual Activity  . Alcohol use: No  . Drug use: No  . Sexual activity: Not on file  Other Topics Concern  . Not on file  Social History Narrative  . Not on file    Family History  Problem Relation Age of Onset  . Heart failure Mother   . Heart failure Father   . Rheumatic fever Brother     ROS- All systems are reviewed and negative except as per the HPI above  Physical Exam: Vitals:   05/08/17 1110  BP: 108/64  Pulse: 96  Weight: 187 lb (84.8 kg)  Height: 5\' 9"  (1.753 m)   Wt Readings from Last 3 Encounters:  05/08/17 187 lb (84.8 kg)  05/01/17 187 lb 12.8 oz (85.2 kg)  09/12/16 183 lb (83 kg)    Labs: Lab Results  Component Value Date   NA 141 06/27/2016   K 4.1 06/27/2016   CL 102 06/27/2016   CO2 20 06/27/2016   GLUCOSE 100 (H) 06/27/2016   BUN 19 06/27/2016   CREATININE 1.18 06/27/2016   CALCIUM 9.0 06/27/2016   Lab Results  Component Value Date   INR 1.0 01/13/2009   Lab Results  Component Value Date   CHOL 148 05/18/2016   HDL 47 05/18/2016   LDLCALC 78  05/18/2016   TRIG 116 05/18/2016     GEN- The patient is well appearing, alert and oriented x 3 today.   Head- normocephalic, atraumatic Eyes-  Sclera clear, conjunctiva pink Ears- hearing intact Oropharynx- clear Neck- supple, no JVP Lymph- no cervical lymphadenopathy Lungs- Clear to ausculation bilaterally, normal work of breathing Heart- Regular rate and rhythm, no murmurs, rubs or gallops, PMI not  laterally displaced GI- soft, NT, ND, + BS Extremities- no clubbing, cyanosis, or edema MS- no significant deformity or atrophy Skin- no rash or lesion Psych- euthymic mood, full affect Neuro- strength and sensation are intact  EKG-afib at 96 bpm, qrs int 76 ms, qtc 404 ms Echo -09/2016 Study Conclusions  - Left ventricle: GLSS is normal at -20.9% The cavity size was   normal. Systolic function was normal. The estimated ejection   fraction was in the range of 60% to 65%. Wall motion was normal;   there were no regional wall motion abnormalities. Left   ventricular diastolic function parameters were normal. - Right ventricle: The cavity size was mildly dilated. Wall   thickness was normal. - Atrial septum: There was increased thickness of the septum,   consistent with lipomatous hypertrophy.  Impressions:  - Compared to study of 05/23/2016, LVF has normalized.    Assessment and Plan: 1. Persistent afib - Pt has returned to afib He has acceptable rate control and is not that symptomatic currently I will plan on cardioversion after pt restarts Eliquis for 3 weeks He is willing to do this for the 3 weeks orior to cardioversion and the 4 weeks afterward but would like to get back to Nettokinase. Informed pt that there are no clinical trials with this drug to say it is sufficient enough  of an anticoagulant to fully protect him against stroke Pt acknowledges this and states understanding, he also says he does not feel as well when he takes Eliquis Chadsvasc score is at least  2  F/u in 2 weeks  [name], [facility] [address] [place], [place] [address] [phone]

## 2017-05-22 ENCOUNTER — Ambulatory Visit (HOSPITAL_COMMUNITY)
Admission: RE | Admit: 2017-05-22 | Discharge: 2017-05-22 | Disposition: A | Discharge status: Home / Self Care | Payer: Federal, State, Local not specified - PPO | Source: Ambulatory Visit | Attending: Nurse Practitioner | Admitting: Nurse Practitioner

## 2017-05-22 ENCOUNTER — Encounter (HOSPITAL_COMMUNITY): Payer: Self-pay | Admitting: Nurse Practitioner

## 2017-05-22 VITALS — BP 108/74 | HR 101 | Ht 69.0 in | Wt 187.0 lb

## 2017-05-22 DIAGNOSIS — Z79899 Other long term (current) drug therapy: Secondary | ICD-10-CM | POA: Diagnosis not present

## 2017-05-22 DIAGNOSIS — Z8546 Personal history of malignant neoplasm of prostate: Secondary | ICD-10-CM | POA: Insufficient documentation

## 2017-05-22 DIAGNOSIS — I481 Persistent atrial fibrillation: Secondary | ICD-10-CM | POA: Insufficient documentation

## 2017-05-22 DIAGNOSIS — I4819 Other persistent atrial fibrillation: Secondary | ICD-10-CM

## 2017-05-22 DIAGNOSIS — Z7901 Long term (current) use of anticoagulants: Secondary | ICD-10-CM | POA: Diagnosis not present

## 2017-05-22 LAB — TSH: TSH: 2.324 u[IU]/mL (ref 0.350–4.500)

## 2017-05-22 LAB — CBC
HEMATOCRIT: 42.5 % (ref 39.0–52.0)
Hemoglobin: 13.6 g/dL (ref 13.0–17.0)
MCH: 25.9 pg — AB (ref 26.0–34.0)
MCHC: 32 g/dL (ref 30.0–36.0)
MCV: 80.8 fL (ref 78.0–100.0)
Platelets: 91 10*3/uL — ABNORMAL LOW (ref 150–400)
RBC: 5.26 MIL/uL (ref 4.22–5.81)
RDW: 16.6 % — AB (ref 11.5–15.5)
WBC: 6.6 10*3/uL (ref 4.0–10.5)

## 2017-05-22 LAB — BASIC METABOLIC PANEL
ANION GAP: 10 (ref 5–15)
BUN: 20 mg/dL (ref 6–20)
CALCIUM: 9.2 mg/dL (ref 8.9–10.3)
CO2: 22 mmol/L (ref 22–32)
CREATININE: 1.31 mg/dL — AB (ref 0.61–1.24)
Chloride: 108 mmol/L (ref 101–111)
GFR calc Af Amer: >60 mL/min (ref >60)
GFR calc non Af Amer: 52 mL/min — ABNORMAL LOW (ref >60)
GLUCOSE: 111 mg/dL — AB (ref 65–99)
Potassium: 4.1 mmol/L (ref 3.5–5.1)
Sodium: 140 mmol/L (ref 135–145)

## 2017-05-22 NOTE — Progress Notes (Signed)
Primary Care Physician: Zane Herald, MD Referring Physician: Dr. Pennie Banter [place] is a 72 y.o. male with a h/o paroxysmal afib that is in the afib clinic for evaluation. He gives a history of afib that was newly diagnosed in the fall of 2017 and had successful cardioversion 04/2016. He has enjoyed SR until recently when he noted return to afib. Echo when first diagnosed showed reduced EF that normalized with return of SR. He was initially on eliquis but after cardioversion stopped this and went on a " natural" blood thinner, Nattokinase and asa 81 mg tid. He also takes 15 other supplements daily.  He is willing to go back on eliquis 5 mg bid for the 3 weeks before and 4 weeks afterward to have a cardioversion but wants to return to Nattokinase after that. He states that he does not feel as well on eliquis and he likes to give blood on a regular basis to manage his polycythemia and he can not do that with eliquis on board. Recent creatine by his records 1.28 03/2017.  He does not smoke, drink alcohol, use excessive caffeine. Wife states that he snores but pt is not interested in a sleep study.  F/u in fib clinic, he remains in afib. He had first full day of Eliquis 05/09/17 and will plan on cardioversion after 2/6 for 3 full week anticoagulation.   Today, he denies symptoms of palpitations, chest pain, shortness of breath, orthopnea, PND, lower extremity edema, dizziness, presyncope, syncope, or neurologic sequela. The patient is tolerating medications without difficulties and is otherwise without complaint today.   Past Medical History:  Diagnosis Date  . 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 Prostate cancer ([place]) 06-11-2006   Past Surgical History:  Procedure Laterality Date  . CARDIOVERSION N/A 05/23/2016   Procedure: CARDIOVERSION;  Surgeon: Skeet Latch, MD;  Location: [facility];  Service: Cardiovascular;  Laterality: N/A;  . NEPHRECTOMY  1995   right  nephrectomy after tramua  . TEE WITHOUT CARDIOVERSION N/A 05/23/2016   Procedure: TRANSESOPHAGEAL ECHOCARDIOGRAM (TEE);  Surgeon: Skeet Latch, MD;  Location: [facility] ENDOSCOPY;  Service: Cardiovascular;  Laterality: N/A;    Current Outpatient Medications  Medication Sig Dispense Refill  . apixaban (ELIQUIS) 5 MG TABS tablet Take 1 tablet (5 mg total) by mouth 2 (two) times daily. 60 tablet 0  . EC-RX TESTOSTERONE 10 % CREA Place 1 application onto the skin daily.    Marland Kitchen GLYCINE PO Take 1,000 mg by mouth daily.    Marland Kitchen ibuprofen (ADVIL,MOTRIN) 200 MG tablet Take 200 mg by mouth as needed.    Marland Kitchen losartan (COZAAR) 50 MG tablet Take 1 tablet (50 mg total) by mouth daily. 30 tablet 11  . Lysine 1000 MG TABS Take 1 tablet by mouth 2 (two) times daily.    . metoprolol tartrate (LOPRESSOR) 25 MG tablet Take 1 tablet (25 mg total) by mouth 2 (two) times daily. 60 tablet 11  . Multiple Vitamin (MULTIVITAMIN) capsule Take 1 capsule by mouth daily.    Marland Kitchen NATURE-THROID 32.5 MG tablet Take 32.5 mg by mouth 2 (two) times daily.     Marland Kitchen OVER THE COUNTER MEDICATION Aloe Vera juice    . OVER THE COUNTER MEDICATION Take 4 mg by mouth daily. Astaxanthin 4mg  daily    . OVER THE COUNTER MEDICATION Take 1 tablet by mouth daily. Joint formula 1 daily    . OVER THE COUNTER MEDICATION High potency dry Vitamin d-3 1,000mg  or 1 gr.    Marland Kitchen  OVER THE COUNTER MEDICATION L-Proline 500 mg (joints)    . OVER THE COUNTER MEDICATION Glycine 1000 mg (kidney)    . OVER THE COUNTER MEDICATION Take 1 tablet by mouth 2 (two) times daily. Nicinamide 500 mg; Vitamin B-3 1000 mg (cholesterol)     . OVER THE COUNTER MEDICATION Pregnenolone 100 mg    . OVER THE COUNTER MEDICATION Complete Probiotics 800 mg (digestion)    . OVER THE COUNTER MEDICATION 2 tablets daily.    Marland Kitchen OVER THE COUNTER MEDICATION Take 60 capsules by mouth daily.    Marland Kitchen OVER THE COUNTER MEDICATION Take 2 tablets by mouth daily.    Marland Kitchen OVER THE COUNTER MEDICATION Take 1 Scoop by  mouth 2 (two) times daily.    . vitamin C (ASCORBIC ACID) 500 MG tablet Take 500 mg by mouth daily.    . Cholecalciferol 1000 units tablet Take 1,000 Units by mouth 2 (two) times daily.    . Multiple Vitamin (MULTIVITAMIN WITH MINERALS) TABS tablet Take 1 tablet by mouth 2 (two) times daily.    . vitamin C (ASCORBIC ACID) 500 MG tablet Take 500 mg by mouth 2 (two) times daily.     No current facility-administered medications for this encounter.     Allergies  Allergen Reactions  . Gluten Meal   . Lactose Intolerance (Gi)   . Morphine Sulfate Other (See Comments)    Hallucinations    Social History   Socioeconomic History  . Marital status: Married    Spouse name: Not on file  . Number of children: Not on file  . Years of education: Not on file  . Highest education level: Not on file  Social Needs  . Financial resource strain: Not on file  . Food insecurity - worry: Not on file  . Food insecurity - inability: Not on file  . Transportation needs - medical: Not on file  . Transportation needs - non-medical: Not on file  Occupational History  . Not on file  Tobacco Use  . Smoking status: Never Smoker  . Smokeless tobacco: Never Used  Substance and Sexual Activity  . Alcohol use: No  . Drug use: No  . Sexual activity: Not on file  Other Topics Concern  . Not on file  Social History Narrative  . Not on file    Family History  Problem Relation Age of Onset  . Heart failure Mother   . Heart failure Father   . Rheumatic fever Brother     ROS- All systems are reviewed and negative except as per the HPI above  Physical Exam: Vitals:   05/22/17 1125  BP: 108/74  Pulse: (!) 101  Weight: 187 lb (84.8 kg)  Height: 5\' 9"  (1.753 m)   Wt Readings from Last 3 Encounters:  05/22/17 187 lb (84.8 kg)  05/08/17 187 lb (84.8 kg)  05/01/17 187 lb 12.8 oz (85.2 kg)    Labs: Lab Results  Component Value Date   NA 140 05/22/2017   K 4.1 05/22/2017   CL 108 05/22/2017    CO2 22 05/22/2017   GLUCOSE 111 (H) 05/22/2017   BUN 20 05/22/2017   CREATININE 1.31 (H) 05/22/2017   CALCIUM 9.2 05/22/2017   Lab Results  Component Value Date   INR 1.0 01/13/2009   Lab Results  Component Value Date   CHOL 148 05/18/2016   HDL 47 05/18/2016   LDLCALC 78 05/18/2016   TRIG 116 05/18/2016     GEN- The patient is  well appearing, alert and oriented x 3 today.   Head- normocephalic, atraumatic Eyes-  Sclera clear, conjunctiva pink Ears- hearing intact Oropharynx- clear Neck- supple, no JVP Lymph- no cervical lymphadenopathy Lungs- Clear to ausculation bilaterally, normal work of breathing Heart- irregular rate and rhythm, no murmurs, rubs or gallops, PMI not laterally displaced GI- soft, NT, ND, + BS Extremities- no clubbing, cyanosis, or edema MS- no significant deformity or atrophy Skin- no rash or lesion Psych- euthymic mood, full affect Neuro- strength and sensation are intact  EKG-afib at 101 bpm, qrs int 86 ms, qtc 430 ms Echo -09/2016 Study Conclusions  - Left ventricle: GLSS is normal at -20.9% The cavity size was   normal. Systolic function was normal. The estimated ejection   fraction was in the range of 60% to 65%. Wall motion was normal;   there were no regional wall motion abnormalities. Left   ventricular diastolic function parameters were normal. - Right ventricle: The cavity size was mildly dilated. Wall   thickness was normal. - Atrial septum: There was increased thickness of the septum,   consistent with lipomatous hypertrophy.  Impressions:  - Compared to study of 05/23/2016, LVF has normalized.    Assessment and Plan: 1. Persistent afib - Pt has returned to afib He has acceptable rate control and is not that symptomatic currently I will plan on cardioversion after pt on uninterrupted Eliquis for 3 weeks, will schedule for 05/31/17 He is willing to take eliquis for the 3 weeks prior to cardioversion and the 4 weeks afterward  but would like to get back to Nettokinase. Informed pt that there are no clinical trials with this drug to say it is sufficient enough  of an anticoagulant to fully protect him against stroke Pt acknowledges this and states understanding, He also says he does not feel as well when he takes Eliquis Chadsvasc score is at least 2 Bmet/cbc/tsh today for pre procedure labs  F/u in 2 weeks  Butch Penny C. Karigan Cloninger, [facility] [address] [place], [place] [address] [phone]

## 2017-05-22 NOTE — H&P (View-Only) (Signed)
Primary Care Physician: Zane Herald, MD Referring Physician: Dr. Pennie Banter Blas[place] is a 72 y.o. male with a h/o paroxysmal afib that is in the afib clinic for evaluation. He gives a history of afib that was newly diagnosed in the fall of 2017 and had successful cardioversion 04/2016. He has enjoyed SR until recently when he noted return to afib. Echo when first diagnosed showed reduced EF that normalized with return of SR. He was initially on eliquis but after cardioversion stopped this and went on a " natural" blood thinner, Nattokinase and asa 81 mg tid. He also takes 15 other supplements daily.  He is willing to go back on eliquis 5 mg bid for the 3 weeks before and 4 weeks afterward to have a cardioversion but wants to return to Nattokinase after that. He states that he does not feel as well on eliquis and he likes to give blood on a regular basis to manage his polycythemia and he can not do that with eliquis on board. Recent creatine by his records 1.28 03/2017.  He does not smoke, drink alcohol, use excessive caffeine. Wife states that he snores but pt is not interested in a sleep study.  F/u in fib clinic, he remains in afib. He had first full day of Eliquis 05/09/17 and will plan on cardioversion after 2/6 for 3 full week anticoagulation.   Today, he denies symptoms of palpitations, chest pain, shortness of breath, orthopnea, PND, lower extremity edema, dizziness, presyncope, syncope, or neurologic sequela. The patient is tolerating medications without difficulties and is otherwise without complaint today.   Past Medical History:  Diagnosis Date  . 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 Prostate cancer ([place]) 06-11-2006   Past Surgical History:  Procedure Laterality Date  . CARDIOVERSION N/A 05/23/2016   Procedure: CARDIOVERSION;  Surgeon: Skeet Latch, MD;  Location: [facility];  Service: Cardiovascular;  Laterality: N/A;  . NEPHRECTOMY  1995   right  nephrectomy after tramua  . TEE WITHOUT CARDIOVERSION N/A 05/23/2016   Procedure: TRANSESOPHAGEAL ECHOCARDIOGRAM (TEE);  Surgeon: Skeet Latch, MD;  Location: [facility] ENDOSCOPY;  Service: Cardiovascular;  Laterality: N/A;    Current Outpatient Medications  Medication Sig Dispense Refill  . apixaban (ELIQUIS) 5 MG TABS tablet Take 1 tablet (5 mg total) by mouth 2 (two) times daily. 60 tablet 0  . EC-RX TESTOSTERONE 10 % CREA Place 1 application onto the skin daily.    Marland Kitchen GLYCINE PO Take 1,000 mg by mouth daily.    Marland Kitchen ibuprofen (ADVIL,MOTRIN) 200 MG tablet Take 200 mg by mouth as needed.    Marland Kitchen losartan (COZAAR) 50 MG tablet Take 1 tablet (50 mg total) by mouth daily. 30 tablet 11  . Lysine 1000 MG TABS Take 1 tablet by mouth 2 (two) times daily.    . metoprolol tartrate (LOPRESSOR) 25 MG tablet Take 1 tablet (25 mg total) by mouth 2 (two) times daily. 60 tablet 11  . Multiple Vitamin (MULTIVITAMIN) capsule Take 1 capsule by mouth daily.    Marland Kitchen NATURE-THROID 32.5 MG tablet Take 32.5 mg by mouth 2 (two) times daily.     Marland Kitchen OVER THE COUNTER MEDICATION Aloe Vera juice    . OVER THE COUNTER MEDICATION Take 4 mg by mouth daily. Astaxanthin 4mg  daily    . OVER THE COUNTER MEDICATION Take 1 tablet by mouth daily. Joint formula 1 daily    . OVER THE COUNTER MEDICATION High potency dry Vitamin d-3 1,000mg  or 1 gr.    Marland Kitchen  OVER THE COUNTER MEDICATION L-Proline 500 mg (joints)    . OVER THE COUNTER MEDICATION Glycine 1000 mg (kidney)    . OVER THE COUNTER MEDICATION Take 1 tablet by mouth 2 (two) times daily. Nicinamide 500 mg; Vitamin B-3 1000 mg (cholesterol)     . OVER THE COUNTER MEDICATION Pregnenolone 100 mg    . OVER THE COUNTER MEDICATION Complete Probiotics 800 mg (digestion)    . OVER THE COUNTER MEDICATION 2 tablets daily.    Marland Kitchen OVER THE COUNTER MEDICATION Take 60 capsules by mouth daily.    Marland Kitchen OVER THE COUNTER MEDICATION Take 2 tablets by mouth daily.    Marland Kitchen OVER THE COUNTER MEDICATION Take 1 Scoop by  mouth 2 (two) times daily.    . vitamin C (ASCORBIC ACID) 500 MG tablet Take 500 mg by mouth daily.    . Cholecalciferol 1000 units tablet Take 1,000 Units by mouth 2 (two) times daily.    . Multiple Vitamin (MULTIVITAMIN WITH MINERALS) TABS tablet Take 1 tablet by mouth 2 (two) times daily.    . vitamin C (ASCORBIC ACID) 500 MG tablet Take 500 mg by mouth 2 (two) times daily.     No current facility-administered medications for this encounter.     Allergies  Allergen Reactions  . Gluten Meal   . Lactose Intolerance (Gi)   . Morphine Sulfate Other (See Comments)    Hallucinations    Social History   Socioeconomic History  . Marital status: Married    Spouse name: Not on file  . Number of children: Not on file  . Years of education: Not on file  . Highest education level: Not on file  Social Needs  . Financial resource strain: Not on file  . Food insecurity - worry: Not on file  . Food insecurity - inability: Not on file  . Transportation needs - medical: Not on file  . Transportation needs - non-medical: Not on file  Occupational History  . Not on file  Tobacco Use  . Smoking status: Never Smoker  . Smokeless tobacco: Never Used  Substance and Sexual Activity  . Alcohol use: No  . Drug use: No  . Sexual activity: Not on file  Other Topics Concern  . Not on file  Social History Narrative  . Not on file    Family History  Problem Relation Age of Onset  . Heart failure Mother   . Heart failure Father   . Rheumatic fever Brother     ROS- All systems are reviewed and negative except as per the HPI above  Physical Exam: Vitals:   05/22/17 1125  BP: 108/74  Pulse: (!) 101  Weight: 187 lb (84.8 kg)  Height: 5\' 9"  (1.753 m)   Wt Readings from Last 3 Encounters:  05/22/17 187 lb (84.8 kg)  05/08/17 187 lb (84.8 kg)  05/01/17 187 lb 12.8 oz (85.2 kg)    Labs: Lab Results  Component Value Date   NA 140 05/22/2017   K 4.1 05/22/2017   CL 108 05/22/2017    CO2 22 05/22/2017   GLUCOSE 111 (H) 05/22/2017   BUN 20 05/22/2017   CREATININE 1.31 (H) 05/22/2017   CALCIUM 9.2 05/22/2017   Lab Results  Component Value Date   INR 1.0 01/13/2009   Lab Results  Component Value Date   CHOL 148 05/18/2016   HDL 47 05/18/2016   LDLCALC 78 05/18/2016   TRIG 116 05/18/2016     GEN- The patient is  well appearing, alert and oriented x 3 today.   Head- normocephalic, atraumatic Eyes-  Sclera clear, conjunctiva pink Ears- hearing intact Oropharynx- clear Neck- supple, no JVP Lymph- no cervical lymphadenopathy Lungs- Clear to ausculation bilaterally, normal work of breathing Heart- irregular rate and rhythm, no murmurs, rubs or gallops, PMI not laterally displaced GI- soft, NT, ND, + BS Extremities- no clubbing, cyanosis, or edema MS- no significant deformity or atrophy Skin- no rash or lesion Psych- euthymic mood, full affect Neuro- strength and sensation are intact  EKG-afib at 101 bpm, qrs int 86 ms, qtc 430 ms Echo -09/2016 Study Conclusions  - Left ventricle: GLSS is normal at -20.9% The cavity size was   normal. Systolic function was normal. The estimated ejection   fraction was in the range of 60% to 65%. Wall motion was normal;   there were no regional wall motion abnormalities. Left   ventricular diastolic function parameters were normal. - Right ventricle: The cavity size was mildly dilated. Wall   thickness was normal. - Atrial septum: There was increased thickness of the septum,   consistent with lipomatous hypertrophy.  Impressions:  - Compared to study of 05/23/2016, LVF has normalized.    Assessment and Plan: 1. Persistent afib - Pt has returned to afib He has acceptable rate control and is not that symptomatic currently I will plan on cardioversion after pt on uninterrupted Eliquis for 3 weeks, will schedule for 05/31/17 He is willing to take eliquis for the 3 weeks prior to cardioversion and the 4 weeks afterward  but would like to get back to Nettokinase. Informed pt that there are no clinical trials with this drug to say it is sufficient enough  of an anticoagulant to fully protect him against stroke Pt acknowledges this and states understanding, He also says he does not feel as well when he takes Eliquis Chadsvasc score is at least 2 Bmet/cbc/tsh today for pre procedure labs  F/u in 2 weeks  Butch Penny C. Brigett Estell, [facility] [address] [place], [place] [address] [phone]

## 2017-05-22 NOTE — Patient Instructions (Signed)
Cardioversion scheduled for Thursday, February 7th  - Arrive at the [facility] and go to admitting at 12:30pm  -Do not eat or drink anything after midnight the night prior to your procedure.  - Take all your medication with a sip of water prior to arrival.  - You will not be able to drive home after your procedure.

## 2017-05-31 ENCOUNTER — Other Ambulatory Visit: Payer: Self-pay

## 2017-05-31 ENCOUNTER — Ambulatory Visit (HOSPITAL_COMMUNITY): Payer: Federal, State, Local not specified - PPO | Admitting: Certified Registered"

## 2017-05-31 ENCOUNTER — Encounter (HOSPITAL_COMMUNITY): Payer: Self-pay | Admitting: *Deleted

## 2017-05-31 ENCOUNTER — Ambulatory Visit (HOSPITAL_COMMUNITY)
Admission: RE | Admit: 2017-05-31 | Discharge: 2017-05-31 | Disposition: A | Discharge status: Home / Self Care | Payer: Federal, State, Local not specified - PPO | Source: Ambulatory Visit | Attending: Cardiology | Admitting: Cardiology

## 2017-05-31 ENCOUNTER — Encounter (HOSPITAL_COMMUNITY)
Admission: RE | Disposition: A | Discharge status: Home / Self Care | Payer: Self-pay | Source: Ambulatory Visit | Attending: Cardiology

## 2017-05-31 DIAGNOSIS — I481 Persistent atrial fibrillation: Secondary | ICD-10-CM | POA: Insufficient documentation

## 2017-05-31 DIAGNOSIS — Z79899 Other long term (current) drug therapy: Secondary | ICD-10-CM | POA: Diagnosis not present

## 2017-05-31 DIAGNOSIS — Z885 Allergy status to narcotic agent status: Secondary | ICD-10-CM | POA: Insufficient documentation

## 2017-05-31 DIAGNOSIS — Z8249 Family history of ischemic heart disease and other diseases of the circulatory system: Secondary | ICD-10-CM | POA: Insufficient documentation

## 2017-05-31 DIAGNOSIS — E739 Lactose intolerance, unspecified: Secondary | ICD-10-CM | POA: Diagnosis not present

## 2017-05-31 DIAGNOSIS — Z8546 Personal history of malignant neoplasm of prostate: Secondary | ICD-10-CM | POA: Insufficient documentation

## 2017-05-31 DIAGNOSIS — Z905 Acquired absence of kidney: Secondary | ICD-10-CM | POA: Insufficient documentation

## 2017-05-31 DIAGNOSIS — K9041 Non-celiac gluten sensitivity: Secondary | ICD-10-CM | POA: Insufficient documentation

## 2017-05-31 DIAGNOSIS — Z7901 Long term (current) use of anticoagulants: Secondary | ICD-10-CM | POA: Diagnosis not present

## 2017-05-31 DIAGNOSIS — I4891 Unspecified atrial fibrillation: Secondary | ICD-10-CM | POA: Diagnosis not present

## 2017-05-31 HISTORY — PX: CARDIOVERSION: SHX1299

## 2017-05-31 SURGERY — CARDIOVERSION
Anesthesia: General

## 2017-05-31 MED ORDER — SODIUM CHLORIDE 0.9 % IV SOLN
INTRAVENOUS | Status: DC
  Administered 2017-05-31: 14:00:00 via INTRAVENOUS

## 2017-05-31 MED ORDER — LIDOCAINE 2% (20 MG/ML) 5 ML SYRINGE
INTRAMUSCULAR | Status: DC | PRN
  Administered 2017-05-31: 60 mg via INTRAVENOUS

## 2017-05-31 MED ORDER — PROPOFOL 10 MG/ML IV BOLUS
INTRAVENOUS | Status: DC | PRN
  Administered 2017-05-31: 80 mg via INTRAVENOUS

## 2017-05-31 NOTE — Transfer of Care (Signed)
Immediate Anesthesia Transfer of Care Note  Patient: [name]  Procedure(s) Performed: CARDIOVERSION (N/A )  Patient Location: Endoscopy Unit  Anesthesia Type:General  Level of Consciousness: drowsy and responds to stimulation  Airway & Oxygen Therapy: Patient Spontanous Breathing  Post-op Assessment: Report given to RN and Post -op Vital signs reviewed and stable  Post vital signs: Reviewed and stable  Last Vitals:  Vitals:   05/31/17 1433 05/31/17 1434  BP:  121/81  Pulse: 69 78  Resp: (!) 9 (!) 21  SpO2: 100% 100%    Last Pain:  Vitals:   05/31/17 1248  TempSrc: Oral         Complications: No apparent anesthesia complications

## 2017-05-31 NOTE — Anesthesia Postprocedure Evaluation (Signed)
Anesthesia Post Note  Patient: [name]  Procedure(s) Performed: CARDIOVERSION (N/A )     Patient location during evaluation: Endoscopy Anesthesia Type: General Level of consciousness: awake and sedated Pain management: pain level controlled Vital Signs Assessment: post-procedure vital signs reviewed and stable Respiratory status: spontaneous breathing Cardiovascular status: stable Postop Assessment: adequate PO intake Anesthetic complications: no    Last Vitals:  Vitals:   05/31/17 1433 05/31/17 1434  BP:  121/81  Pulse: 69 78  Resp: (!) 9 (!) 21  Temp:  36.5 C  SpO2: 100% 100%    Last Pain:  Vitals:   05/31/17 1434  TempSrc: Oral   Pain Goal:                 Brantly Kalman JR,JOHN Audery Wassenaar

## 2017-05-31 NOTE — Interval H&P Note (Signed)
History and Physical Interval Note:  05/31/2017 2:23 PM  [name]  has presented today for surgery, with the diagnosis of AFIB  The various methods of treatment have been discussed with the patient and family. After consideration of risks, benefits and other options for treatment, the patient has consented to  Procedure(s): CARDIOVERSION (N/A) as a surgical intervention .  The patient's history has been reviewed, patient examined, no change in status, stable for surgery.  I have reviewed the patient's chart and labs.  Questions were answered to the patient's satisfaction.     Noralee Dutko [employer]

## 2017-05-31 NOTE — Anesthesia Preprocedure Evaluation (Signed)
Anesthesia Evaluation  Patient identified by MRN, date of birth, ID band Patient awake    Reviewed: Allergy & Precautions, NPO status , Patient's Chart, lab work & pertinent test results  Airway Mallampati: II       Dental no notable dental hx. (+) Teeth Intact   Pulmonary neg pulmonary ROS,    breath sounds clear to auscultation       Cardiovascular + dysrhythmias Atrial Fibrillation  Rhythm:Irregular Rate:Normal     Neuro/Psych negative neurological ROS  negative psych ROS   GI/Hepatic negative GI ROS, Neg liver ROS,   Endo/Other  negative endocrine ROS  Renal/GU negative Renal ROS  negative genitourinary   Musculoskeletal negative musculoskeletal ROS (+)   Abdominal Normal abdominal exam  (+)   Peds negative pediatric ROS (+)  Hematology negative hematology ROS (+)   Anesthesia Other Findings   Reproductive/Obstetrics negative OB ROS                             Anesthesia Physical  Anesthesia Plan  ASA: III  Anesthesia Plan: General   Post-op Pain Management:    Induction: Intravenous  PONV Risk Score and Plan: 1  Airway Management Planned: Natural Airway and Simple Face Mask  Additional Equipment:   Intra-op Plan:   Post-operative Plan:   Informed Consent: I have reviewed the patients History and Physical, chart, labs and discussed the procedure including the risks, benefits and alternatives for the proposed anesthesia with the patient or authorized representative who has indicated his/her understanding and acceptance.     Plan Discussed with: CRNA  Anesthesia Plan Comments:         Anesthesia Quick Evaluation

## 2017-05-31 NOTE — Procedures (Signed)
Electrical Cardioversion Procedure Note [name] [id number] Jan 15, 1946  Procedure: Electrical Cardioversion Indications:  Atrial Fibrillation  Procedure Details Consent: Risks of procedure as well as the alternatives and risks of each were explained to the (patient/caregiver).  Consent for procedure obtained. Time Out: Verified patient identification, verified procedure, site/side was marked, verified correct patient position, special equipment/implants available, medications/allergies/relevent history reviewed, required imaging and test results available.  Performed  Patient placed on cardiac monitor, pulse oximetry, supplemental oxygen as necessary.  Sedation given: Propofol per anesthesiology. Pacer pads placed anterior and posterior chest.  Cardioverted 1 time(s).  Cardioverted at [place].  Evaluation Findings: Post procedure EKG shows: NSR Complications: None Patient did tolerate procedure well.   Loralie Champagne 05/31/2017, 2:32 PM

## 2017-05-31 NOTE — Discharge Instructions (Signed)
Electrical Cardioversion, Care After °This sheet gives you information about how to care for yourself after your procedure. Your health care provider may also give you more specific instructions. If you have problems or questions, contact your health care provider. °What can I expect after the procedure? °After the procedure, it is common to have: °· Some redness on the skin where the shocks were given. ° °Follow these instructions at home: °· Do not drive for 24 hours if you were given a medicine to help you relax (sedative). °· Take over-the-counter and prescription medicines only as told by your health care provider. °· Ask your health care provider how to check your pulse. Check it often. °· Rest for 48 hours after the procedure or as told by your health care provider. °· Avoid or limit your caffeine use as told by your health care provider. °Contact a health care provider if: °· You feel like your heart is beating too quickly or your pulse is not regular. °· You have a serious muscle cramp that does not go away. °Get help right away if: °· You have discomfort in your chest. °· You are dizzy or you feel faint. °· You have trouble breathing or you are short of breath. °· Your speech is slurred. °· You have trouble moving an arm or leg on one side of your body. °· Your fingers or toes turn cold or blue. °This information is not intended to replace advice given to you by your health care provider. Make sure you discuss any questions you have with your health care provider. °Document Released: 01/29/2013 Document Revised: 11/12/2015 Document Reviewed: 10/15/2015 °Elsevier Interactive Patient Education © 2018 [employer] ° °

## 2017-06-01 ENCOUNTER — Encounter (HOSPITAL_COMMUNITY): Payer: Self-pay | Admitting: Cardiology

## 2017-06-07 ENCOUNTER — Encounter (HOSPITAL_COMMUNITY): Payer: Self-pay | Admitting: Nurse Practitioner

## 2017-06-07 ENCOUNTER — Ambulatory Visit (HOSPITAL_COMMUNITY)
Admission: RE | Admit: 2017-06-07 | Discharge: 2017-06-07 | Disposition: A | Discharge status: Home / Self Care | Payer: Federal, State, Local not specified - PPO | Source: Ambulatory Visit | Attending: Nurse Practitioner | Admitting: Nurse Practitioner

## 2017-06-07 VITALS — BP 122/76 | HR 61 | Ht 69.0 in | Wt 186.4 lb

## 2017-06-07 DIAGNOSIS — I4819 Other persistent atrial fibrillation: Secondary | ICD-10-CM

## 2017-06-07 DIAGNOSIS — I481 Persistent atrial fibrillation: Secondary | ICD-10-CM | POA: Insufficient documentation

## 2017-06-07 DIAGNOSIS — Z7901 Long term (current) use of anticoagulants: Secondary | ICD-10-CM | POA: Diagnosis not present

## 2017-06-07 DIAGNOSIS — Z885 Allergy status to narcotic agent status: Secondary | ICD-10-CM | POA: Insufficient documentation

## 2017-06-07 LAB — CBC
HCT: 43.6 % (ref 39.0–52.0)
HEMOGLOBIN: 14 g/dL (ref 13.0–17.0)
MCH: 26.1 pg (ref 26.0–34.0)
MCHC: 32.1 g/dL (ref 30.0–36.0)
MCV: 81.2 fL (ref 78.0–100.0)
Platelets: 107 10*3/uL — ABNORMAL LOW (ref 150–400)
RBC: 5.37 MIL/uL (ref 4.22–5.81)
RDW: 17.6 % — ABNORMAL HIGH (ref 11.5–15.5)
WBC: 5.7 10*3/uL (ref 4.0–10.5)

## 2017-06-07 LAB — BASIC METABOLIC PANEL
Anion gap: 8 (ref 5–15)
BUN: 15 mg/dL (ref 6–20)
CHLORIDE: 111 mmol/L (ref 101–111)
CO2: 23 mmol/L (ref 22–32)
CREATININE: 1.12 mg/dL (ref 0.61–1.24)
Calcium: 9.2 mg/dL (ref 8.9–10.3)
GFR calc Af Amer: >60 mL/min (ref >60)
GFR calc non Af Amer: >60 mL/min (ref >60)
GLUCOSE: 109 mg/dL — AB (ref 65–99)
Potassium: 4 mmol/L (ref 3.5–5.1)
SODIUM: 142 mmol/L (ref 135–145)

## 2017-06-07 NOTE — Progress Notes (Signed)
Primary Care Physician: Jordan Herald, MD Referring Physician: Dr. Pennie Banter Marchiano is a 72 y.o. male with a h/o paroxysmal afib that is in the afib clinic for evaluation. He gives a history of afib that was newly diagnosed in the fall of 2017 and had successful cardioversion 04/2016. He has enjoyed SR until recently when he noted return to afib. Echo when first diagnosed showed reduced EF that normalized with return of SR. He was initially on eliquis but after cardioversion stopped this and went on a " natural" blood thinner, Nattokinase and asa 81 mg tid. He also takes 15 other supplements daily.  He is willing to go back on eliquis 5 mg bid for the 3 weeks before and 4 weeks afterward to have a cardioversion but wants to return to Nattokinase after that. He states that he does not feel as well on eliquis and he likes to give blood on a regular basis to manage his polycythemia and he can not do that with eliquis on board. Recent creatine by his records 1.28 03/2017.  He does not smoke, drink alcohol, use excessive caffeine. Wife states that he snores but pt is not interested in a sleep study.  F/u in fib clinic, he remains in afib. He had first full day of Eliquis 05/09/17 and will plan on cardioversion after 2/6 for 3 full week anticoagulation.   F/u in afib clinic after successful cardioversion, 2/7 and remains in SR. He continues on eliquis but plans to go back to his natural blood thinner after this date. Will repeat cbc as his plts reported out lower prior to cardioversion. Will repeat bmet as well for his concern for elevated creatinine on previous lab and pt having a single kidney.  Today, he denies symptoms of palpitations, chest pain, shortness of breath, orthopnea, PND, lower extremity edema, dizziness, presyncope, syncope, or neurologic sequela. The patient is tolerating medications without difficulties and is otherwise without complaint today.   Past Medical History:    Diagnosis Date  . 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 Prostate cancer ([place]) 06-11-2006   Past Surgical History:  Procedure Laterality Date  . BILATERAL KNEE ARTHROSCOPY  1980  . CARDIOVERSION N/A 05/23/2016   Procedure: CARDIOVERSION;  Surgeon: Skeet Latch, MD;  Location: [place];  Service: Cardiovascular;  Laterality: N/A;  . CARDIOVERSION N/A 05/31/2017   Procedure: CARDIOVERSION;  Surgeon: Larey Dresser, MD;  [facility] ENDOSCOPY;  Service: Cardiovascular;  Laterality: N/A;  . NEPHRECTOMY  1995   right nephrectomy after tramua  . TEE WITHOUT CARDIOVERSION N/A 05/23/2016   Procedure: TRANSESOPHAGEAL ECHOCARDIOGRAM (TEE);  Surgeon: Skeet Latch, MD;  Location: [facility] ENDOSCOPY;  Service: Cardiovascular;  Laterality: N/A;    Current Outpatient Medications  Medication Sig Dispense Refill  . apixaban (ELIQUIS) 5 MG TABS tablet Take 1 tablet (5 mg total) by mouth 2 (two) times daily. 60 tablet 0  . Boswellia Serrata (BOSWELLIA PO) Take 250 mg by mouth 2 (two) times daily.    . Cholecalciferol 1000 units tablet Take 1,000 Units by mouth 2 (two) times daily.    Marland Kitchen DHEA 50 MG CAPS Take 50 mg by mouth daily.    Marland Kitchen EC-RX TESTOSTERONE 10 % CREA Place 1 application onto the skin daily.    Marland Kitchen GLYCINE PO Take 1,000 mg by mouth daily.    Marland Kitchen ibuprofen (ADVIL,MOTRIN) 200 MG tablet Take 200-400 mg by mouth every 6 (six) hours as needed for headache or moderate pain.     Marland Kitchen  losartan (COZAAR) 50 MG tablet Take 1 tablet (50 mg total) by mouth daily. 30 tablet 11  . Lysine 1000 MG TABS Take 1,000 mg by mouth 2 (two) times daily.     . Melatonin 5 MG CAPS Take 5 mg by mouth at bedtime as needed (for sleep).    . metoprolol tartrate (LOPRESSOR) 25 MG tablet Take 1 tablet (25 mg total) by mouth 2 (two) times daily. 60 tablet 11  . Multiple Vitamin (MULTIVITAMIN WITH MINERALS) TABS tablet Take 1 tablet by mouth 2 (two) times daily.    Marland Kitchen NATURE-THROID 32.5 MG tablet Take 32.5 mg by  mouth 2 (two) times daily.     . niacinamide 500 MG tablet Take 500 mg by mouth 2 (two) times daily with a meal.    . OVER THE COUNTER MEDICATION Take 4 mg by mouth daily. Astaxanthin 4mg     . OVER THE COUNTER MEDICATION Take 1,000 mg by mouth daily. L-Proline 500 mg (joints)     . OVER THE COUNTER MEDICATION Take 1 tablet by mouth 2 (two) times daily. Nicinamide 500 mg; Vitamin B-3 1000 mg (cholesterol)     . OVER THE COUNTER MEDICATION Take 2 tablets by mouth daily.    Marland Kitchen OVER THE COUNTER MEDICATION Take 1 capsule by mouth daily. Ultimate 16 Strain Probiotic    . Pregnenolone POWD Take 100 mg by mouth daily.    . Quercetin 250 MG TABS Take 250 mg by mouth 2 (two) times daily.    . vitamin C (ASCORBIC ACID) 500 MG tablet Take 500 mg by mouth 2 (two) times daily.     No current facility-administered medications for this encounter.     Allergies  Allergen Reactions  . Morphine Sulfate Other (See Comments)    Hallucinations    Social History   Socioeconomic History  . Marital status: Married    Spouse name: Not on file  . Number of children: Not on file  . Years of education: Not on file  . Highest education level: Not on file  Social Needs  . Financial resource strain: Not on file  . Food insecurity - worry: Not on file  . Food insecurity - inability: Not on file  . Transportation needs - medical: Not on file  . Transportation needs - non-medical: Not on file  Occupational History  . Not on file  Tobacco Use  . Smoking status: Never Smoker  . Smokeless tobacco: Never Used  Substance and Sexual Activity  . Alcohol use: No  . Drug use: No  . Sexual activity: Not on file  Other Topics Concern  . Not on file  Social History Narrative  . Not on file    Family History  Problem Relation Age of Onset  . Heart failure Mother   . Heart failure Father   . Rheumatic fever Brother     ROS- All systems are reviewed and negative except as per the HPI above  Physical  Exam: Vitals:   06/07/17 1131  BP: 122/76  Pulse: 61  Weight: 186 lb 6.4 oz (84.6 kg)  Height: 5\' 9"  (1.753 m)   Wt Readings from Last 3 Encounters:  06/07/17 186 lb 6.4 oz (84.6 kg)  05/22/17 187 lb (84.8 kg)  05/08/17 187 lb (84.8 kg)    Labs: Lab Results  Component Value Date   NA 142 06/07/2017   K 4.0 06/07/2017   CL 111 06/07/2017   CO2 23 06/07/2017   GLUCOSE 109 (H) 06/07/2017  BUN 15 06/07/2017   CREATININE 1.12 06/07/2017   CALCIUM 9.2 06/07/2017   Lab Results  Component Value Date   INR 1.0 01/13/2009   Lab Results  Component Value Date   CHOL 148 05/18/2016   HDL 47 05/18/2016   LDLCALC 78 05/18/2016   TRIG 116 05/18/2016     GEN- The patient is well appearing, alert and oriented x 3 today.   Head- normocephalic, atraumatic Eyes-  Sclera clear, conjunctiva pink Ears- hearing intact Oropharynx- clear Neck- supple, no JVP Lymph- no cervical lymphadenopathy Lungs- Clear to ausculation bilaterally, normal work of breathing Heart- irregular rate and rhythm, no murmurs, rubs or gallops, PMI not laterally displaced GI- soft, NT, ND, + BS Extremities- no clubbing, cyanosis, or edema MS- no significant deformity or atrophy Skin- no rash or lesion Psych- euthymic mood, full affect Neuro- strength and sensation are intact  EKG-SR at 61 bpm, pr int 158 ms, qrs int 70 ms, qtc 396 ms Echo -09/2016 Study Conclusions  - Left ventricle: GLSS is normal at -20.9% The cavity size was   normal. Systolic function was normal. The estimated ejection   fraction was in the range of 60% to 65%. Wall motion was normal;   there were no regional wall motion abnormalities. Left   ventricular diastolic function parameters were normal. - Right ventricle: The cavity size was mildly dilated. Wall   thickness was normal. - Atrial septum: There was increased thickness of the septum,   consistent with lipomatous hypertrophy.  Impressions:  - Compared to study of  05/23/2016, LVF has normalized.    Assessment and Plan: 1. Persistent afib - Pt had successful cardioversion, 05/31/17, and is continuing in SR   He is willing to take eliquis in able to have cardioversion, at least  3 weeks prior to cardioversion and the 4 weeks afterward but he would like to get back to Nettokinase. Informed pt that there are no clinical trials with this drug to say it is sufficient enough  of an anticoagulant to fully protect him against stroke Pt acknowledges this and states understanding, He also says he does not feel as well when he takes Eliquis Chadsvasc score is at least 2 Repeat bmet/cbc today  F/u with Dr. Acie Fredrickson as per recall Early June  Aracelie Addis C. Julianah Marciel, [facility] [address] [place], [place] [address] [phone]

## 2017-06-08 ENCOUNTER — Encounter: Payer: Self-pay | Admitting: Gastroenterology

## 2017-07-02 ENCOUNTER — Other Ambulatory Visit: Payer: Self-pay | Admitting: Cardiovascular Disease

## 2017-07-02 NOTE — Telephone Encounter (Signed)
Eliquis 5mg  refill request received; pt is 72 yrs old, Wt-85.2kg, Crea-1.12 on 06/07/17, last seen by Roderic Palau on 06/07/17,will send in refill to requested pharmacy.

## 2017-07-23 ENCOUNTER — Ambulatory Visit: Payer: Federal, State, Local not specified - PPO | Admitting: Gastroenterology

## 2017-07-23 ENCOUNTER — Telehealth: Payer: Self-pay

## 2017-07-23 ENCOUNTER — Encounter: Payer: Self-pay | Admitting: Gastroenterology

## 2017-07-23 VITALS — BP 120/68 | HR 64 | Ht 69.0 in | Wt 185.1 lb

## 2017-07-23 DIAGNOSIS — I483 Typical atrial flutter: Secondary | ICD-10-CM

## 2017-07-23 DIAGNOSIS — K529 Noninfective gastroenteritis and colitis, unspecified: Secondary | ICD-10-CM

## 2017-07-23 DIAGNOSIS — D696 Thrombocytopenia, unspecified: Secondary | ICD-10-CM

## 2017-07-23 DIAGNOSIS — Z7901 Long term (current) use of anticoagulants: Secondary | ICD-10-CM | POA: Diagnosis not present

## 2017-07-23 MED ORDER — PEG-KCL-NACL-NASULF-NA ASC-C 140 G PO SOLR: 1.0000 | ORAL | Status: DC

## 2017-07-23 NOTE — Telephone Encounter (Signed)
[facility] Pre-operative Risk Assessment     Request for surgical clearance:     Endoscopy Procedure  What type of surgery is being performed?     Colonoscopy  When is this surgery scheduled?     08/01/2017  What type of clearance is required ?   Pharmacy  Are there any medications that need to be held prior to surgery and how long? Eliquis, 2 days  Practice name and name of physician performing surgery?      [facility] with Dr. Loletha Carrow  What is your office phone and fax number?      Phone- [phone]  [phone]  Anesthesia type (None, local, MAC, general) ?       MAC

## 2017-07-23 NOTE — Progress Notes (Signed)
[facility] Consult Note:  HistoryRajesh Stanley 07/23/2017  Referring physician: Zane Herald, MD  Reason for consult/chief complaint: Diarrhea (mnths. Denies abdominal pain or bleeding. Relates to taking Eliquis. Would like to d/c Eliquis. )   Subjective  HPI:  This is a very pleasant 72 year old man referred by primary care for chronic diarrhea.  He reports that occurred sometime in the last couple of years but then got better on its own.  But for at least the last 6 months he has had at most 2 BMs per day but vary from pasty to watery.  He denies nocturnal diarrhea or incontinence.  He stopped all of his dietary supplements, but had no change in the diarrhea.  He thinks it may have been somewhat worse when he was on Eliquis last year, but stopping it only gave modest improvement in the symptoms.  He denies abdominal pain, bloating, gas or rectal bleeding.  He denies weight loss, nausea, vomiting or dysphagia.  He believes his last colonoscopy was in [place] about 14 years ago. He has a history of paroxysmal atrial fibrillation, and I reviewed recent cardiology office notes.  There was prior cardioversion, and because he was back in atrial fibrillation  during his January 2019 visit, he was offered another cardioversion, which he underwent on February 7.  According to cardiology notes, the patient was taking a "natural blood thinner" (Nattokinase) despite cardiology's recommendations to take standard oral anticoagulation.  He was in sinus rhythm at last cardiology follow-up on February 14. He also remains on Eliquis since then.  ROS:  Review of Systems  Constitutional: Negative for appetite change and unexpected weight change.  HENT: Negative for mouth sores and voice change.   Eyes: Negative for pain and redness.  Respiratory: Negative for cough and shortness of breath.   Cardiovascular: Negative for chest pain and palpitations.  Genitourinary: Negative for  dysuria and hematuria.  Musculoskeletal: Negative for arthralgias and myalgias.  Skin: Negative for pallor and rash.  Neurological: Negative for weakness and headaches.  Hematological: Negative for adenopathy.     Past Medical History: Past Medical History:  Diagnosis Date  . 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 Prostate cancer ([place]) 06-11-2006     Past Surgical History: Past Surgical History:  Procedure Laterality Date  . BILATERAL KNEE ARTHROSCOPY  1980  . CARDIOVERSION N/A 05/23/2016   Procedure: CARDIOVERSION;  Surgeon: Skeet Latch, MD;  Location: [place];  Service: Cardiovascular;  Laterality: N/A;  . CARDIOVERSION N/A 05/31/2017   Procedure: CARDIOVERSION;  Surgeon: Larey Dresser, MD;  Location: [facility] ENDOSCOPY;  Service: Cardiovascular;  Laterality: N/A;  . NEPHRECTOMY  1995   right nephrectomy after tramua  . TEE WITHOUT CARDIOVERSION N/A 05/23/2016   Procedure: TRANSESOPHAGEAL ECHOCARDIOGRAM (TEE);  Surgeon: Skeet Latch, MD;  Location: [facility] ENDOSCOPY;  Service: Cardiovascular;  Laterality: N/A;     Family History: Family History  Problem Relation Age of Onset  . Heart failure Mother   . Heart failure Father   . Rheumatic fever Brother    No Crohn's or UC No CRC  Social History: Social History   Socioeconomic History  . Marital status: Married    Spouse name: Not on file  . Number of children: Not on file  . Years of education: Not on file  . Highest education level: Not on file  Occupational History  . Not on file  Social Needs  . Financial resource strain: Not on file  . Food insecurity:  Worry: Not on file    Inability: Not on file  . Transportation needs:    Medical: Not on file    Non-medical: Not on file  Tobacco Use  . Smoking status: Never Smoker  . Smokeless tobacco: Never Used  Substance and Sexual Activity  . Alcohol use: No  . Drug use: No  . Sexual activity: Not on file  Lifestyle  . Physical activity:     Days per week: Not on file    Minutes per session: Not on file  . Stress: Not on file  Relationships  . Social connections:    Talks on phone: Not on file    Gets together: Not on file    Attends religious service: Not on file    Active member of club or organization: Not on file    Attends meetings of clubs or organizations: Not on file    Relationship status: Not on file  Other Topics Concern  . Not on file  Social History Narrative  . Not on file   He had heavy alcohol use in the late 60s/early 70s for period of time, but not since then.  He has never had pancreatitis.  Allergies: Allergies  Allergen Reactions  . Morphine Sulfate Other (See Comments)    Hallucinations    Outpatient Meds: Current Outpatient Medications  Medication Sig Dispense Refill  . Boswellia Serrata (BOSWELLIA PO) Take 250 mg by mouth 2 (two) times daily.    . Cholecalciferol 1000 units tablet Take 1,000 Units by mouth 2 (two) times daily.    Marland Kitchen DHEA 50 MG CAPS Take 50 mg by mouth daily.    Marland Kitchen EC-RX TESTOSTERONE 10 % CREA Place 1 application onto the skin daily.    Marland Kitchen ELIQUIS 5 MG TABS tablet TAKE 1 TABLET BY MOUTH TWICE DAILY. 60 tablet 11  . GLYCINE PO Take 1,000 mg by mouth daily.    Marland Kitchen ibuprofen (ADVIL,MOTRIN) 200 MG tablet Take 200-400 mg by mouth every 6 (six) hours as needed for headache or moderate pain.     Marland Kitchen Lysine 1000 MG TABS Take 1,000 mg by mouth 2 (two) times daily.     . Melatonin 5 MG CAPS Take 5 mg by mouth at bedtime as needed (for sleep).    . Multiple Vitamin (MULTIVITAMIN WITH MINERALS) TABS tablet Take 1 tablet by mouth 2 (two) times daily.    Marland Kitchen NATURE-THROID 32.5 MG tablet Take 32.5 mg by mouth 2 (two) times daily.     . niacinamide 500 MG tablet Take 500 mg by mouth 2 (two) times daily with a meal.    . OVER THE COUNTER MEDICATION Take 4 mg by mouth daily. Astaxanthin '4mg'$     . OVER THE COUNTER MEDICATION Take 1,000 mg by mouth daily. L-Proline 500 mg (joints)     . OVER THE  COUNTER MEDICATION Take 1 tablet by mouth 2 (two) times daily. Nicinamide 500 mg; Vitamin B-3 1000 mg (cholesterol)     . OVER THE COUNTER MEDICATION Take 2 tablets by mouth daily.    Marland Kitchen OVER THE COUNTER MEDICATION Take 1 capsule by mouth daily. Ultimate 16 Strain Probiotic    . Pregnenolone POWD Take 100 mg by mouth daily.    . Quercetin 250 MG TABS Take 250 mg by mouth 2 (two) times daily.    . vitamin C (ASCORBIC ACID) 500 MG tablet Take 500 mg by mouth 2 (two) times daily.    Marland Kitchen losartan (COZAAR) 50 MG tablet Take  1 tablet (50 mg total) by mouth daily. 30 tablet 11  . metoprolol tartrate (LOPRESSOR) 25 MG tablet Take 1 tablet (25 mg total) by mouth 2 (two) times daily. 60 tablet 11  . PEG-KCl-NaCl-NaSulf-Na Asc-C (PLENVU) 140 g SOLR Take 1 kit by mouth as directed. 1 each kit   No current facility-administered medications for this visit.       ___________________________________________________________________ Objective   Exam:  BP 120/68   Pulse 64   Ht '5\' 9"'$  (1.753 m)   Wt 185 lb 2 oz (84 kg)   BMI 27.34 kg/m    General: this is a(n) well-appearing man, normal muscle mass  Eyes: sclera anicteric, no redness  ENT: oral mucosa moist without lesions, no cervical or supraclavicular lymphadenopathy, good dentition  CV: RRR without murmur, S1/S2, no JVD, no peripheral edema  Resp: clear to auscultation bilaterally, normal RR and effort noted  GI: soft, no tenderness, with active bowel sounds. No guarding or palpable organomegaly noted.  Skin; warm and dry, no rash or jaundice noted  Neuro: awake, alert and oriented x 3. Normal gross motor function and fluent speech  Labs:  CMP Latest Ref Rng & Units 06/07/2017 05/22/2017 06/27/2016  Glucose 65 - 99 mg/dL 109(H) 111(H) 100(H)  BUN 6 - 20 mg/dL '15 20 19  '$ Creatinine 0.61 - 1.24 mg/dL 1.12 1.31(H) 1.18  Sodium 135 - 145 mmol/L 142 140 141  Potassium 3.5 - 5.1 mmol/L 4.0 4.1 4.1  Chloride 101 - 111 mmol/L 111 108 102  CO2 22  - 32 mmol/L '23 22 20  '$ Calcium 8.9 - 10.3 mg/dL 9.2 9.2 9.0  Total Protein 6.0 - 8.5 g/dL - - -  Total Bilirubin 0.0 - 1.2 mg/dL - - -  Alkaline Phos 39 - 117 IU/L - - -  AST 0 - 40 IU/L - - -  ALT 0 - 44 IU/L - - -   CBC Latest Ref Rng & Units 06/07/2017 05/22/2017 06/27/2016  WBC 4.0 - 10.5 K/uL 5.7 6.6 6.0  Hemoglobin 13.0 - 17.0 g/dL 14.0 13.6 18.2(H)  Hematocrit 39.0 - 52.0 % 43.6 42.5 50.7  Platelets 150 - 400 K/uL 107(L) 91(L) 102(L)     Radiologic Studies:  LVEF 60-65% June 2018  Assessment: Encounter Diagnoses  Name Primary?  . Chronic diarrhea Yes  . Typical atrial flutter ([place])   . Thrombocytopenia ([place])   . Chronic anticoagulation    Chronic diarrhea with most 2 BMs per day of loose to watery consistency.  It does not sound like malabsorption, and he has had no weight loss.  It may be maldigestion, and I gave him some written dietary advice.  He seems to feel the symptoms either came on and got worse when he and his wife started having vegetables on a more regular basis.  It may be microscopic colitis, and for that I advised him to have a colonoscopy with biopsies.  He would need to be off his oral anticoagulation 2 days prior.  The procedure was described in detail along with risks and benefits and he is agreeable.  The benefits and risks of the planned procedure were described in detail with the patient or (when appropriate) their health care proxy.  Risks were outlined as including, but not limited to, bleeding, infection, perforation, adverse medication reaction leading to cardiac or pulmonary decompensation, or pancreatitis (if ERCP).  The limitation of incomplete mucosal visualization was also discussed.  No guarantees or warranties were given.  We will communicate with  his cardiologist to be sure they feel he can be off anticoagulation 2 days prior and perhaps up to 2 days afterwards if polyps are removed.  He understands there is a risk of  stroke while he is off  anticoagulation.   Thank you for the courtesy of this consult.  Please call me with any questions or concerns.  Nelida Meuse III  CC: Jordan Herald, MD

## 2017-07-23 NOTE — Patient Instructions (Addendum)
Food Guidelines for a sensitive stomach  Many people have difficulty digesting certain foods, causing a variety of distressing and embarrassing symptoms such as abdominal pain, bloating and gas.  These foods may need to be avoided or consumed in small amounts.  Here are some tips that might be helpful for you.  1.   Lactose intolerance is the difficulty or complete inability to digest lactose, the natural sugar in milk and anything made from milk.  This condition is harmless, common, and can begin any time during life.  Some people can digest a modest amount of lactose while others cannot tolerate any.  Also, not all dairy products contain equal amounts of lactose.  For example, hard cheeses such as parmesan have less lactose than soft cheeses such as cheddar.  Yogurt has less lactose than milk or cheese.  Many packaged foods (even many brands of bread) have milk, so read ingredient lists carefully.  It is difficult to test for lactose intolerance, so just try avoiding lactose as much as possible for a week and see what happens with your symptoms.  If you seem to be lactose intolerant, the best plan is to avoid it (but make sure you get calcium from another source).  The next best thing is to use lactase enzyme supplements, available over the counter everywhere.  Just know that many lactose intolerant people need to take several tablets with each serving of dairy to avoid symptoms.  Lastly, a lot of restaurant food is made with milk or butter.  Many are things you might not suspect, such as mashed potatoes, rice and pasta (cooked with butter) and "grilled" items.  If you are lactose intolerant, it never hurts to ask your server what has milk or butter.  2.   Fiber is an important part of your diet, but not all fiber is well-tolerated.  Insoluble fiber such as bran is often consumed by normal gut bacteria and converted into gas.  Soluble fiber such as oats, squash, carrots and green beans are typically  tolerated better.  3.   Some types of carbohydrates can be poorly digested.  Examples include: fructose (apples, cherries, pears, raisins and other dried fruits), fructans (onions, zucchini, large amounts of wheat), sorbitol/mannitol/xylitol and sucralose/Splenda (common artificial sweeteners), and raffinose (lentils, broccoli, cabbage, asparagus, brussel sprouts, many types of beans).  Do a Development worker, community for [employer] and you will find helpful information. Beano, a dietary supplement, will often help with raffinose-containing foods.  As with lactase tablets, you may need several per serving.  4.   Whenever possible, avoid processed food&meats and chemical additives.  High fructose corn syrup, a common sweetener, may be difficult to digest.  Eggs and soy (comes from the soybean, and added to many foods now) are other common bloating/gassy foods.  - Dr. [facility]  If you are age 74 or older, your body mass index should be between 23-30. Your Body mass index is 27.34 kg/m. If this is out of the aforementioned range listed, please consider follow up with your Primary Care Provider.  If you are age 47 or younger, your body mass index should be between 19-25. Your Body mass index is 27.34 kg/m. If this is out of the aformentioned range listed, please consider follow up with your Primary Care Provider.   You have been scheduled for a colonoscopy. Please follow written instructions given to you at your visit today.  Please pick up your prep supplies at the pharmacy within the  next 1-3 days. If you use inhalers (even only as needed), please bring them with you on the day of your procedure. Your physician has requested that you go to www.startemmi.com and enter the access code given to you at your visit today. This web site gives a general overview about your procedure. However, you should still follow specific instructions given to you by our office regarding your preparation  for the procedure.  You will be contacted by our office prior to your procedure for directions on holding your Eliquis.  If you do not hear from our office 1 week prior to your scheduled procedure, please call [phone] to discuss.   Thank you for choosing [facility]  Dr Wilfrid Lund III

## 2017-07-25 NOTE — Telephone Encounter (Signed)
Patient with diagnosis of Afib on Eliquis for anticoagulation.  Last cardioverted on 05/31/17, now at least 4 weeks post conversion.   Procedure: colonoscopy Date of procedure: 08/01/17  CHADS2-VASc score of  3 (CHF, HTN, AGE, DM2, stroke/tia x 2, CAD, AGE, male)  CrCl 69ml/min  Per office protocol, patient can hold Eliquis for 24 hours prior to procedure.    Routed back to provider via epic.

## 2017-07-26 ENCOUNTER — Telehealth: Payer: Self-pay | Admitting: *Deleted

## 2017-07-26 ENCOUNTER — Encounter: Payer: Self-pay | Admitting: Gastroenterology

## 2017-07-26 NOTE — Telephone Encounter (Signed)
-----   Message from Lonn Georgia, PA-C sent at 07/26/2017  4:28 PM EDT -----   ----- Message ----- From: Erlene Quan, PA-C Sent: 07/26/2017   1:14 PM To: Rebeca Alert Preop  Mr. Candella may hold his Eliquis for 2 days prior to GI procedure.    Mertie Moores, MD  07/24/2017 1:11 PM    [facility] [place],  [address], [place]  [address] Pager [phone] Phone: [phone]; Fax: [phone]    ----- Message ----- From: Doran Stabler, MD Sent: 07/23/2017   1:06 PM To: Thayer Headings, MD

## 2017-07-26 NOTE — Telephone Encounter (Signed)
Spoke to patient he verbally confirmed understanding to hold his Eliquis for 24 hours prior to procedure.

## 2017-07-26 NOTE — Telephone Encounter (Signed)
Spoke to patient, per Rosaria Ferries PA. informed patient he needs to hold  eliquis  2 days prior to colonoscopy on 08/01/17.  Last dose will be on 07/29/17. Patient verbalized understanding.

## 2017-07-27 ENCOUNTER — Telehealth: Payer: Self-pay | Admitting: Gastroenterology

## 2017-07-27 ENCOUNTER — Other Ambulatory Visit: Payer: Self-pay

## 2017-07-27 MED ORDER — PEG-KCL-NACL-NASULF-NA ASC-C 140 G PO SOLR: 1.0000 | ORAL | Status: DC

## 2017-07-27 NOTE — Telephone Encounter (Signed)
Rx resent to pharmacy

## 2017-07-30 ENCOUNTER — Other Ambulatory Visit: Payer: Self-pay

## 2017-07-30 MED ORDER — PEG-KCL-NACL-NASULF-NA ASC-C 140 G PO SOLR: 1.0000 | ORAL | Status: DC

## 2017-08-01 ENCOUNTER — Ambulatory Visit (AMBULATORY_SURGERY_CENTER): Payer: Federal, State, Local not specified - PPO | Admitting: Gastroenterology

## 2017-08-01 ENCOUNTER — Encounter: Payer: Self-pay | Admitting: Gastroenterology

## 2017-08-01 ENCOUNTER — Other Ambulatory Visit: Payer: Self-pay

## 2017-08-01 VITALS — BP 116/71 | HR 57 | Temp 97.5°F | Resp 11 | Ht 69.0 in | Wt 185.0 lb

## 2017-08-01 DIAGNOSIS — D122 Benign neoplasm of ascending colon: Secondary | ICD-10-CM | POA: Diagnosis not present

## 2017-08-01 DIAGNOSIS — D123 Benign neoplasm of transverse colon: Secondary | ICD-10-CM

## 2017-08-01 DIAGNOSIS — K529 Noninfective gastroenteritis and colitis, unspecified: Secondary | ICD-10-CM | POA: Diagnosis present

## 2017-08-01 MED ORDER — SODIUM CHLORIDE 0.9 % IV SOLN: 500.0000 mL | Freq: Once | INTRAVENOUS | Status: AC

## 2017-08-01 NOTE — Progress Notes (Signed)
A/ox3, pleased with MAC, report to  [facility]

## 2017-08-01 NOTE — Progress Notes (Signed)
Pt's states no medical or surgical changes since previsit or office visit. 

## 2017-08-01 NOTE — Patient Instructions (Signed)
YOU HAD AN ENDOSCOPIC PROCEDURE TODAY AT [facility]:   Refer to the procedure report that was given to you for any specific questions about what was found during the examination.  If the procedure report does not answer your questions, please call your gastroenterologist to clarify.  If you requested that your care partner not be given the details of your procedure findings, then the procedure report has been included in a sealed envelope for you to review at your convenience later.  YOU SHOULD EXPECT: Some feelings of bloating in the abdomen. Passage of more gas than usual.  Walking can help get rid of the air that was put into your GI tract during the procedure and reduce the bloating. If you had a lower endoscopy (such as a colonoscopy or flexible sigmoidoscopy) you may notice spotting of blood in your stool or on the toilet paper. If you underwent a bowel prep for your procedure, you may not have a normal bowel movement for a few days.  Please Note:  You might notice some irritation and congestion in your nose or some drainage.  This is from the oxygen used during your procedure.  There is no need for concern and it should clear up in a day or so.  SYMPTOMS TO REPORT IMMEDIATELY:   Following lower endoscopy (colonoscopy or flexible sigmoidoscopy):  Excessive amounts of blood in the stool  Significant tenderness or worsening of abdominal pains  Swelling of the abdomen that is new, acute  Fever of 100F or higher  For urgent or emergent issues, a gastroenterologist can be reached at any hour by calling [phone].   DIET:  We do recommend a small meal at first, but then you may proceed to your regular diet.  Drink plenty of fluids but you should avoid alcoholic beverages for 24 hours.  ACTIVITY:  You should plan to take it easy for the rest of today and you should NOT DRIVE or use heavy machinery until tomorrow (because of the sedation medicines used during the test).     FOLLOW UP: Our staff will call the number listed on your records the next business day following your procedure to check on you and address any questions or concerns that you may have regarding the information given to you following your procedure. If we do not reach you, we will leave a message.  However, if you are feeling well and you are not experiencing any problems, there is no need to return our call.  We will assume that you have returned to your regular daily activities without incident.  If any biopsies were taken you will be contacted by phone or by letter within the next 1-3 weeks.  Please call us at [phone] if you have not heard about the biopsies in 3 weeks.   Await for biopsy results Polyps (handout given) Eliquis resume prior dose tomorrow  SIGNATURES/CONFIDENTIALITY: You and/or your care partner have signed paperwork which will be entered into your electronic medical record.  These signatures attest to the fact that that the information above on your After Visit Summary has been reviewed and is understood.  Full responsibility of the confidentiality of this discharge information lies with you and/or your care-partner.

## 2017-08-01 NOTE — Op Note (Signed)
[facility] Patient Name: [name] Procedure Date: 08/01/2017 9:17 AM MRN: [id number] Endoscopist: Mallie Mussel L. Loletha Carrow , MD Age: 72 Referring MD:  Date of Birth: Sep 06, 1945 Gender: Male Account #: [id number] Procedure:                Colonoscopy Indications:              Chronic diarrhea Medicines:                Monitored Anesthesia Care Procedure:                Pre-Anesthesia Assessment:                           - Prior to the procedure, a History and Physical                            was performed, and patient medications and                            allergies were reviewed. The patient's tolerance of                            previous anesthesia was also reviewed. The risks                            and benefits of the procedure and the sedation                            options and risks were discussed with the patient.                            All questions were answered, and informed consent                            was obtained. Prior Anticoagulants: The patient has                            taken Eliquis (apixaban), last dose was 1 day prior                            to procedure. ASA Grade Assessment: II - A patient                            with mild systemic disease. After reviewing the                            risks and benefits, the patient was deemed in                            satisfactory condition to undergo the procedure.                           After obtaining informed consent, the colonoscope  was passed under direct vision. Throughout the                            procedure, the patient's blood pressure, pulse, and                            oxygen saturations were monitored continuously. The                            Model CF-HQ190L [id number]) scope was introduced                            through the anus and advanced to the the terminal                            ileum. The colonoscopy was performed  without                            difficulty. The patient tolerated the procedure                            well. The quality of the bowel preparation was                            good. The terminal ileum, ileocecal valve,                            appendiceal orifice, and rectum were photographed.                            The quality of the bowel preparation was evaluated                            using the BBPS [facility] Bowel Preparation Scale)                            with scores of: Right Colon = 2, Transverse Colon =                            2 and Left Colon = 2. The total BBPS score equals 6                            (patient consumed all prep evening prior). The                            bowel preparation used was Plenvu. Scope In: 9:27:45 AM Scope Out: 9:54:56 AM Total Procedure Duration: 0 hours 27 minutes 11 seconds  Findings:                 The perianal and digital rectal examinations were                            normal.  The terminal ileum appeared normal.                           The sigmoid colon was redundant.                           Two sessile polyps were found in the proximal                            transverse colon and ascending colon. The polyps                            were 3 mm in size. These polyps were removed with a                            cold biopsy forceps. Resection and retrieval were                            complete.                           A 4 mm polyp was found in the proximal transverse                            colon. The polyp was sessile. The polyp was removed                            with a cold snare. Resection and retrieval were                            complete.                           A few small-mouthed diverticula were found in the                            left colon and right colon.                           An area of congested mucosa was found in the                             sigmoid colon and in the descending colon. Biopsies                            for histology were taken with a cold forceps from                            the right colon and left colon for evaluation of                            microscopic colitis.  Anal papilla(e) were hypertrophied.                           The exam was otherwise without abnormality on                            direct and retroflexion views. Complications:            No immediate complications. Estimated Blood Loss:     Estimated blood loss was minimal. Impression:               - The examined portion of the ileum was normal.                           - Redundant colon.                           - Two 3 mm polyps in the proximal transverse colon                            and in the ascending colon, removed with a cold                            biopsy forceps. Resected and retrieved.                           - One 4 mm polyp in the proximal transverse colon,                            removed with a cold snare. Resected and retrieved.                           - Diverticulosis in the left colon and in the right                            colon.                           - Congested mucosa in the sigmoid colon and in the                            descending colon. Biopsied.                           - Anal papilla(e) were hypertrophied.                           - The examination was otherwise normal on direct                            and retroflexion views. Recommendation:           - Patient has a contact number available for                            emergencies. The signs and symptoms of  potential                            delayed complications were discussed with the                            patient. Return to normal activities tomorrow.                            Written discharge instructions were provided to the                            patient.                            - Resume previous diet.                           - Resume Eliquis (apixaban) at prior dose tomorrow.                           - Await pathology results.                           - Repeat colonoscopy is recommended for                            surveillance. The colonoscopy date will be                            determined after pathology results from today's                            exam become available for review. Henry L. Loletha Carrow, MD 08/01/2017 10:06:32 AM This report has been signed electronically.

## 2017-08-01 NOTE — Progress Notes (Signed)
Called to room to assist during endoscopic procedure.  Patient ID and intended procedure confirmed with present staff. Received instructions for my participation in the procedure from the performing physician.  

## 2017-08-02 ENCOUNTER — Telehealth: Payer: Self-pay | Admitting: *Deleted

## 2017-08-02 NOTE — Telephone Encounter (Signed)
  Follow up Call-  Call back number 08/01/2017  Post procedure Call Back phone  # [phone]  Permission to leave phone message Yes  Some recent data might be hidden     Patient questions:  Do you have a fever, pain , or abdominal swelling? No. Pain Score  0 *  Have you tolerated food without any problems? Yes.    Have you been able to return to your normal activities? Yes.    Do you have any questions about your discharge instructions: Diet   No. Medications  No. Follow up visit  No.  Do you have questions or concerns about your Care? No.  Actions: * If pain score is 4 or above: No action needed, pain <4.

## 2017-08-07 ENCOUNTER — Encounter: Payer: Self-pay | Admitting: Gastroenterology

## 2017-08-08 ENCOUNTER — Telehealth: Payer: Self-pay | Admitting: Gastroenterology

## 2017-08-08 NOTE — Telephone Encounter (Signed)
A user error has taken place.

## 2017-11-06 ENCOUNTER — Encounter: Payer: Self-pay | Admitting: Cardiovascular Disease

## 2017-11-06 ENCOUNTER — Ambulatory Visit: Payer: Federal, State, Local not specified - PPO | Admitting: Cardiovascular Disease

## 2017-11-06 VITALS — BP 110/80 | HR 66 | Ht 69.0 in | Wt 180.2 lb

## 2017-11-06 DIAGNOSIS — I480 Paroxysmal atrial fibrillation: Secondary | ICD-10-CM | POA: Diagnosis not present

## 2017-11-06 NOTE — Patient Instructions (Signed)
Medication Instructions:  Your physician recommends that you continue on your current medications as directed. Please refer to the Current Medication list given to you today.   Labwork: None Ordered   Testing/Procedures: None Ordered   Follow-Up: Your physician wants you to follow-up in: 1 year with Dr. Nahser.  You will receive a reminder letter in the mail two months in advance. If you don't receive a letter, please call our office to schedule the follow-up appointment.   If you need a refill on your cardiac medications before your next appointment, please call your pharmacy.   Thank you for choosing [facility]! Ruth Kovich, RN [phone]    

## 2017-11-06 NOTE — Progress Notes (Signed)
Cardiology Office Note   Date:  11/06/2017   ID:  [name], DOB Aug 24, 1945, MRN [id number]  PCP:  Zane Herald, MD  Cardiologist:   Mertie Moores, MD   Chief Complaint  Patient presents with  . Atrial Fibrillation   Problem List 1. Polycythemia 2. Atrial flutter 3. R nephrectomy - 1975, accident while playing softball 4. Hx of Chronic systolic CHF - EF 73-71% ( possibly rate related due to his atrial fib ) echocardiogram in June, 2018 shows normal left ventricular systolic function.  Original Notes.: [name] is a 72 y.o. male who presents for Further evaluation and management of his atrial flutter.   was diagnosed with polycythemia. He donates blood every 21 days. His fast heart rate was found during one of the blood donations  He is basically asymptomatic.   Cannot tell that his HR is fast. HR is not always that fast.   Used to get regular exercise - limited by hip pain Used to run.   Cycling bothers his hip more.   Not limited by CP or dyspnea.   Feb. 14, 2018: [name] is seen today for follow up up his atrial flutter.   Seen with wife, Caren Griffins.   Echocardiogram done 05/22/2016 shows moderate to severe LV dysfunction with an ejection fraction of 30-35%. He has mild mitral regurgitation. He had a successful TEE / Cardioversion  On Jan. 30, 2018 He thinks that he may be feeling better Does not want to take the Eliquis ( says it decreases his appetite  09/12/2016: [name] is seen back today for follow-up of his paroxysmal atrial fibrillation and chronic congestive heart failure. Is tolerating his meds well Wants to be off the Eliquis .  CHADS2VASC score is 3 ( AGE, CHF, HTN)  We had a long discussion about his risk of CVA and he does not want to take an anticoagulant   May 01, 2016:  [name] is doing well.  Has had some DOE ( especially when carrying things out to the car.  No angina  Is eager to start back exercising .  Has palpitations  he thinks the  episodes are not as severe as his atrial fib  Is not on Eliquis - is taking Natokinase.  .     November 06, 2017:  [name] is seen back today for follow-up visit.  He has paroxysmal atrial fibrillation and congestive heart failure. He refuses to take Eliquis. No weakness or dizziness.     Past Medical History:  Diagnosis Date  . 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 Prostate cancer ([facility]) 06-11-2006    Past Surgical History:  Procedure Laterality Date  . BILATERAL KNEE ARTHROSCOPY  1980  . CARDIOVERSION N/A 05/23/2016   Procedure: CARDIOVERSION;  Surgeon: Skeet Latch, MD;  Location: [facility];  Service: Cardiovascular;  Laterality: N/A;  . CARDIOVERSION N/A 05/31/2017   Procedure: CARDIOVERSION;  Surgeon: Larey Dresser, MD;  Location[facility]COPY;  Service: Cardiovascular;  Laterality: N/A;  . NEPHRECTOMY  1995   right nephrectomy after tramua  . TEE WITHOUT CARDIOVERSION N/A 05/23/2016   Procedure: TRANSESOPHAGEAL ECHOCARDIOGRAM (TEE);  Surgeon: Skeet Latch, MD;  Location[facility] END[facility]COPY;  Service: Cardiovascular;  Laterality: N/A;     Current Outpatient Medications  Medication Sig Dispense Refill  . Boswellia Serrata (BOSWELLIA PO) Take 250 mg by mouth 2 (two) times daily.    . Cholecalciferol 1000 units tablet Take 1,000 Units by mouth 2 (two) times daily.    Marland Kitchen DHEA 50  MG CAPS Take 50 mg by mouth daily.    Marland Kitchen EC-RX TESTOSTERONE 10 % CREA Place 1 application onto the skin daily.    Marland Kitchen GLYCINE PO Take 1,000 mg by mouth daily.    Marland Kitchen ibuprofen (ADVIL,MOTRIN) 200 MG tablet Take 200-400 mg by mouth every 6 (six) hours as needed for headache or moderate pain.     Marland Kitchen losartan (COZAAR) 50 MG tablet Take 1 tablet (50 mg total) by mouth daily. 30 tablet 11  . Lysine 1000 MG TABS Take 1,000 mg by mouth 2 (two) times daily.     . Melatonin 5 MG CAPS Take 5 mg by mouth at bedtime as needed (for sleep).    . Multiple Vitamin (MULTIVITAMIN WITH MINERALS) TABS tablet Take 1  tablet by mouth 2 (two) times daily.    Marland Kitchen NATURE-THROID 32.5 MG tablet Take 32.5 mg by mouth 2 (two) times daily.     . niacinamide 500 MG tablet Take 500 mg by mouth 2 (two) times daily with a meal.    . OVER THE COUNTER MEDICATION Take 4 mg by mouth daily. Astaxanthin 4mg     . OVER THE COUNTER MEDICATION Take 1,000 mg by mouth daily. L-Proline 500 mg (joints)     . OVER THE COUNTER MEDICATION Take 1 tablet by mouth 2 (two) times daily. Nicinamide 500 mg; Vitamin B-3 1000 mg (cholesterol)     . OVER THE COUNTER MEDICATION Take 2 tablets by mouth daily.    Marland Kitchen OVER THE COUNTER MEDICATION Take 1 capsule by mouth daily. Ultimate 16 Strain Probiotic    . Pregnenolone POWD Take 100 mg by mouth daily.    . Quercetin 250 MG TABS Take 250 mg by mouth 2 (two) times daily.    . vitamin C (ASCORBIC ACID) 500 MG tablet Take 500 mg by mouth 2 (two) times daily.    . metoprolol tartrate (LOPRESSOR) 25 MG tablet Take 1 tablet (25 mg total) by mouth 2 (two) times daily. 60 tablet 11   Current Facility-Administered Medications  Medication Dose Route Frequency Provider Last Rate Last Dose  . 0.9 %  sodium chloride infusion  500 mL Intravenous Once Doran Stabler, MD        Allergies:   Morphine sulfate    Social History:  The patient  reports that he has never smoked. He has never used smokeless tobacco. He reports that he does not drink alcohol or use drugs.   Family History:  The patient's family history includes Heart failure in his father and mother; Rheumatic fever in his brother.    ROS: Noted in current hx   Physical Exam: Blood pressure 110/80, pulse 66, height 5\' 9"  (1.753 m), weight 180 lb 3.2 oz (81.7 kg), SpO2 98 %.  GEN:  Well nourished, well developed in no acute distress HEENT: Normal NECK: No JVD; No carotid bruits LYMPHATICS: No lymphadenopathy CARDIAC: RR   RESPIRATORY:  Clear to auscultation without rales, wheezing or rhonchi  ABDOMEN: Soft, non-tender,  non-distended MUSCULOSKELETAL:  No edema; No deformity  SKIN: Warm and dry NEUROLOGIC:  Alert and oriented x 3   EKG:      Recent Labs: 05/22/2017: TSH 2.324 06/07/2017: BUN 15; Creatinine, Ser 1.12; Hemoglobin 14.0; Platelets 107; Potassium 4.0; Sodium 142    Lipid Panel    Component Value Date/Time   CHOL 148 05/18/2016 1007   TRIG 116 05/18/2016 1007   HDL 47 05/18/2016 1007   CHOLHDL 3.1 05/18/2016 1007   LDLCALC  78 05/18/2016 1007      Wt Readings from Last 3 Encounters:  11/06/17 180 lb 3.2 oz (81.7 kg)  08/01/17 185 lb (83.9 kg)  07/23/17 185 lb 2 oz (84 kg)      Other studies Reviewed: Additional studies/ records that were reviewed today include: . Review of the above records demonstrates:    ASSESSMENT AND PLAN:  1.  Atrial Flutter:   CHADS 2VASC = 3 ( AGE, CHF,  HTN)     refused to take Eliquis .    Is in NSR today   2.   Hx of Chronic systolic congestive heart failure:   EF has normalized.   Will see him in 1 year .   Current medicines are reviewed at length with the patient today.  The patient does not have concerns regarding medicines.  Labs/ tests ordered today include:   No orders of the defined types were placed in this encounter.     Mertie Moores, MD  11/06/2017 6:09 PM    [address], [place], [place]  [address] Phone: [phone]; Fax: [phone]

## 2020-01-06 ENCOUNTER — Encounter: Payer: Self-pay | Admitting: Cardiovascular Disease

## 2020-11-22 ENCOUNTER — Encounter: Payer: Self-pay | Admitting: Gastroenterology
